# Patient Record
Sex: Male | Born: 1960 | Race: Black or African American | Hispanic: No | Smoking: Never smoker
Health system: Southern US, Community
[De-identification: ages and names within clinical notes are randomized; demographics above are authoritative.]

## PROBLEM LIST (undated history)

## (undated) DIAGNOSIS — C9590 Leukemia, unspecified not having achieved remission: Secondary | ICD-10-CM

## (undated) HISTORY — PX: CYST REMOVAL HAND: SHX6279

---

## 2006-10-11 ENCOUNTER — Emergency Department: Payer: Self-pay | Admitting: Emergency Medicine

## 2009-05-16 ENCOUNTER — Emergency Department: Payer: Self-pay | Admitting: Emergency Medicine

## 2015-12-28 ENCOUNTER — Emergency Department: Payer: BLUE CROSS/BLUE SHIELD

## 2015-12-28 ENCOUNTER — Encounter: Payer: Self-pay | Admitting: Emergency Medicine

## 2015-12-28 ENCOUNTER — Emergency Department
Admission: EM | Admit: 2015-12-28 | Discharge: 2015-12-28 | Disposition: A | Discharge status: Home / Self Care | Payer: BLUE CROSS/BLUE SHIELD | Attending: Emergency Medicine | Admitting: Emergency Medicine

## 2015-12-28 DIAGNOSIS — C9590 Leukemia, unspecified not having achieved remission: Secondary | ICD-10-CM | POA: Diagnosis not present

## 2015-12-28 DIAGNOSIS — X501XXA Overexertion from prolonged static or awkward postures, initial encounter: Secondary | ICD-10-CM | POA: Diagnosis not present

## 2015-12-28 DIAGNOSIS — Y999 Unspecified external cause status: Secondary | ICD-10-CM | POA: Diagnosis not present

## 2015-12-28 DIAGNOSIS — M25571 Pain in right ankle and joints of right foot: Secondary | ICD-10-CM | POA: Diagnosis present

## 2015-12-28 DIAGNOSIS — Y939 Activity, unspecified: Secondary | ICD-10-CM | POA: Diagnosis not present

## 2015-12-28 DIAGNOSIS — Y929 Unspecified place or not applicable: Secondary | ICD-10-CM | POA: Diagnosis not present

## 2015-12-28 DIAGNOSIS — S93401A Sprain of unspecified ligament of right ankle, initial encounter: Secondary | ICD-10-CM | POA: Diagnosis not present

## 2015-12-28 HISTORY — DX: Leukemia, unspecified not having achieved remission: C95.90

## 2015-12-28 MED ORDER — NAPROXEN 500 MG PO TBEC: 500.0000 mg | DELAYED_RELEASE_TABLET | Freq: Two times a day (BID) | ORAL | Status: DC

## 2015-12-28 NOTE — Discharge Instructions (Signed)
Ankle Sprain °An ankle sprain is an injury to the strong, fibrous tissues (ligaments) that hold the bones of your ankle joint together.  °CAUSES °An ankle sprain is usually caused by a fall or by twisting your ankle. Ankle sprains most commonly occur when you step on the outer edge of your foot, and your ankle turns inward. People who participate in sports are more prone to these types of injuries.  °SYMPTOMS  °· Pain in your ankle. The pain may be present at rest or only when you are trying to stand or walk. °· Swelling. °· Bruising. Bruising may develop immediately or within 1 to 2 days after your injury. °· Difficulty standing or walking, particularly when turning corners or changing directions. °DIAGNOSIS  °Your caregiver will ask you details about your injury and perform a physical exam of your ankle to determine if you have an ankle sprain. During the physical exam, your caregiver will press on and apply pressure to specific areas of your foot and ankle. Your caregiver will try to move your ankle in certain ways. An X-ray exam may be done to be sure a bone was not broken or a ligament did not separate from one of the bones in your ankle (avulsion fracture).  °TREATMENT  °Certain types of braces can help stabilize your ankle. Your caregiver can make a recommendation for this. Your caregiver may recommend the use of medicine for pain. If your sprain is severe, your caregiver may refer you to a surgeon who helps to restore function to parts of your skeletal system (orthopedist) or a physical therapist. °HOME CARE INSTRUCTIONS  °· Apply ice to your injury for 1-2 days or as directed by your caregiver. Applying ice helps to reduce inflammation and pain. °¨ Put ice in a plastic bag. °¨ Place a towel between your skin and the bag. °¨ Leave the ice on for 15-20 minutes at a time, every 2 hours while you are awake. °· Only take over-the-counter or prescription medicines for pain, discomfort, or fever as directed by  your caregiver. °· Elevate your injured ankle above the level of your heart as much as possible for 2-3 days. °· If your caregiver recommends crutches, use them as instructed. Gradually put weight on the affected ankle. Continue to use crutches or a cane until you can walk without feeling pain in your ankle. °· If you have a plaster splint, wear the splint as directed by your caregiver. Do not rest it on anything harder than a pillow for the first 24 hours. Do not put weight on it. Do not get it wet. You may take it off to take a shower or bath. °· You may have been given an elastic bandage to wear around your ankle to provide support. If the elastic bandage is too tight (you have numbness or tingling in your foot or your foot becomes cold and blue), adjust the bandage to make it comfortable. °· If you have an air splint, you may blow more air into it or let air out to make it more comfortable. You may take your splint off at night and before taking a shower or bath. Wiggle your toes in the splint several times per day to decrease swelling. °SEEK MEDICAL CARE IF:  °· You have rapidly increasing bruising or swelling. °· Your toes feel extremely cold or you lose feeling in your foot. °· Your pain is not relieved with medicine. °SEEK IMMEDIATE MEDICAL CARE IF: °· Your toes are numb or blue. °·   You have severe pain that is increasing. MAKE SURE YOU:   Understand these instructions.  Will watch your condition.  Will get help right away if you are not doing well or get worse.   This information is not intended to replace advice given to you by your health care provider. Make sure you discuss any questions you have with your health care provider.   Document Released: 09/16/2005 Document Revised: 10/07/2014 Document Reviewed: 09/28/2011 Elsevier Interactive Patient Education 2016 [employer].  Take the Naprosyn as needed. Rest with the foot elevated when seated. Wear your ace bandage for support.

## 2015-12-28 NOTE — ED Notes (Signed)
Rolled right ankle a few weeks ago, last night "it swelled up" and is painful to walk on this am. Walked with limp to triage.

## 2015-12-28 NOTE — ED Provider Notes (Signed)
[facility] Emergency Department Provider Note ____________________________________________  Time seen: 1131  I have reviewed the triage vital signs and the nursing notes.  HISTORY  Chief Complaint  Ankle Pain  HPI Jared Avery is a 55 y.o. male presents to the ED for evaluation of right ankle pain with onset yesterday. He describes initially he injured his ankle 2 weeks ago after he stepped on a piece of uneven concrete and rolled his ankle. He had 1-2 days of swelling to the outside of the ankle and some minor pain. Yesterday he noticed some swelling again to the ankle but denies any reinjury.He rates his pain at a 5/10 in triage.  Past Medical History  Diagnosis Date  . Leukemia ([place])     There are no active problems to display for this patient.  History reviewed. No pertinent past surgical history.  Current Outpatient Rx  Name  Route  Sig  Dispense  Refill  . naproxen (EC NAPROSYN) 500 MG EC tablet   Oral   Take 1 tablet (500 mg total) by mouth 2 (two) times daily with a meal.   30 tablet   0     Allergies Review of patient's allergies indicates no known allergies.  No family history on file.  Social History Social History  Substance Use Topics  . Smoking status: Never Smoker   . Smokeless tobacco: None  . Alcohol Use: No   Review of Systems  Constitutional: Negative for fever. Cardiovascular: Negative for chest pain. Normal distal pulses Respiratory: Negative for shortness of breath. Musculoskeletal: Negative for back pain. Right ankle pain as above Skin: Negative for rash. Neurological: Negative for headaches, focal weakness or numbness. ____________________________________________  PHYSICAL EXAM:  VITAL SIGNS: ED Triage Vitals  Enc Vitals Group     BP 12/28/15 1129 129/89 mmHg     Pulse Rate 12/28/15 1129 91     Resp 12/28/15 1129 18     Temp 12/28/15 1129 97.4 F (36.3 C)     Temp Source 12/28/15 1129 Oral     SpO2  12/28/15 1129 96 %     Weight 12/28/15 1112 213 lb (96.616 kg)     Height 12/28/15 1112 5\' 7"  (1.702 m)     Head Cir --      Peak Flow --      Pain Score 12/28/15 1112 5     Pain Loc --      Pain Edu? --      Excl. in [place]? --    Constitutional: Alert and oriented. Well appearing and in no distress. Head: Normocephalic and atraumatic. Hematological/Lymphatic/Immunological: No cervical lymphadenopathy. Cardiovascular: Normal rate, regular rhythm. Normal distal pulses Respiratory: Normal respiratory effort.  Musculoskeletal: Right ankle with minimal swelling noted medially and laterally. Patient is tender to palpation over the medial aspect of the ankle as well as the midfoot medially. Normal ankle range of motion is appreciated. No calf or Achilles tenderness is noted. Nontender with normal range of motion in all extremities.  Neurologic:  Normal gait without ataxia. Normal speech and language. No gross focal neurologic deficits are appreciated. Skin:  Skin is warm, dry and intact. No rash noted. ____________________________________________   RADIOLOGY  Right Ankle IMPRESSION: No acute fracture or subluxation. Diffuse mild soft tissue swelling.  I, Alvan Culpepper, Dannielle Karvonen, personally viewed and evaluated these images (plain radiographs) as part of my medical decision making, as well as reviewing the written report by the radiologist. ____________________________________________  PROCEDURES  Ace bandage  ____________________________________________  INITIAL IMPRESSION / ASSESSMENT AND PLAN / ED COURSE  Patient with what appears to be an ankle sprain with some mild joint swelling. He has underlying degenerative changes to the ankle noted on x-ray. He will be discharged with a prescription for EC-Naprosyn the dose as needed. He'll follow with his primary care provider or [facility] for ongoing symptom management. ____________________________________________  FINAL CLINICAL IMPRESSION(S) /  ED DIAGNOSES  Final diagnoses:  Ankle sprain, right, initial encounter      Melvenia Needles, PA-C 12/28/15 1228  Lisa Roca, MD 12/28/15 1511

## 2015-12-28 NOTE — ED Notes (Signed)
Pt has right ankle pain.  States i rolled my ankle several weeks ago stepping on uneven concrete.  Yesterday swelling began in my ankle.  No new injury. Pt alert.

## 2017-03-05 DIAGNOSIS — I10 Essential (primary) hypertension: Secondary | ICD-10-CM | POA: Insufficient documentation

## 2017-06-18 ENCOUNTER — Ambulatory Visit (INDEPENDENT_AMBULATORY_CARE_PROVIDER_SITE_OTHER): Payer: BLUE CROSS/BLUE SHIELD | Admitting: Urology

## 2017-06-18 ENCOUNTER — Encounter: Payer: Self-pay | Admitting: Urology

## 2017-06-18 VITALS — BP 130/78 | HR 65 | Ht 67.0 in | Wt 226.1 lb

## 2017-06-18 DIAGNOSIS — N402 Nodular prostate without lower urinary tract symptoms: Secondary | ICD-10-CM | POA: Diagnosis not present

## 2017-06-18 DIAGNOSIS — N400 Enlarged prostate without lower urinary tract symptoms: Secondary | ICD-10-CM

## 2017-06-18 DIAGNOSIS — Z125 Encounter for screening for malignant neoplasm of prostate: Secondary | ICD-10-CM

## 2017-06-18 NOTE — Progress Notes (Signed)
06/18/2017 2:34 PM   Jared Avery May 02, 1961 [id number]  Referring provider: Valera Castle, [place], [place] [address]  Chief Complaint  Patient presents with  . New Patient (Initial Visit)    Prostate nodule    HPI: The patient is a 56 year old gentleman who presents today for follow-up of nodular prostate. His wound followed by another urologist for this in the past. His PSAs have always been normal. Current PSA is 1.6 in June 2018. He does have a family history of prostate cancer in his father who ended up passing away from lung cancer. He does have a negative prostate biopsy performed by Dr. Ernst Spell approximately 5 years ago for the nodules on his prostate.  He has no other urological complaints this time. He voids well. 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 He voids with a good stream. He denies hematuria. He does have a distant history of passing one kidney stone.  PMH: Past Medical History:  Diagnosis Date  . Leukemia [facility])     Surgical History: Past Surgical History:  Procedure Laterality Date  . CYST REMOVAL HAND      Home Medications:  Allergies as of 06/18/2017   No Known Allergies     Medication List       Accurate as of 06/18/17  2:34 PM. Always use your most recent med list.          naproxen 500 MG EC tablet Commonly known as:  EC NAPROSYN Take 1 tablet (500 mg total) by mouth 2 (two) times daily with a meal.            Discharge Care Instructions        Start     Ordered   06/18/17 0000  PSA     06/18/17 1433      Allergies: No Known Allergies  Family History: Family History  Problem Relation Age of Onset  . Prostate cancer Father   . Bladder Cancer Neg Hx   . Kidney cancer Neg Hx     Social History:  reports that he has never smoked. He has never used smokeless tobacco. He reports that he does not drink alcohol. His drug history is not on file.  ROS: UROLOGY Frequent Urination?: No Hard  to postpone urination?: No Burning/pain with urination?: No Get up at night to urinate?: No Leakage of urine?: No Urine stream starts and stops?: No Trouble starting stream?: No Do you have to strain to urinate?: No Blood in urine?: No Urinary tract infection?: No Sexually transmitted disease?: No Injury to kidneys or bladder?: No Painful intercourse?: No Weak stream?: No Erection problems?: No Penile pain?: No  Gastrointestinal Nausea?: No Vomiting?: No Indigestion/heartburn?: No Diarrhea?: No Constipation?: No  Constitutional Fever: No Night sweats?: No Weight loss?: No Fatigue?: No  Skin Skin rash/lesions?: No Itching?: No  Eyes Blurred vision?: No Double vision?: No  Ears/Nose/Throat Sore throat?: No Sinus problems?: No  Hematologic/Lymphatic Swollen glands?: No Easy bruising?: No  Cardiovascular Leg swelling?: No Chest pain?: No  Respiratory Cough?: No Shortness of breath?: No  Endocrine Excessive thirst?: No  Musculoskeletal Back pain?: No Joint pain?: No  Neurological Headaches?: No Dizziness?: No  Psychologic Depression?: No Anxiety?: No  Physical Exam: BP 130/78 (BP Location: Left Arm, Patient Position: Sitting, Cuff Size: Normal)   Pulse 65   Ht 5\' 7"  (1.702 m)   Wt 226 lb 1.6 oz (102.6 kg)   BMI 35.41 kg/m  Constitutional:  Alert and oriented, No acute distress. HEENT: [place] AT, moist mucus membranes.  Trachea midline, no masses. Cardiovascular: No clubbing, cyanosis, or edema. Respiratory: Normal respiratory effort, no increased work of breathing. GI: Abdomen is soft, nontender, nondistended, no abdominal masses GU: No CVA tenderness. Normal phallus. Testicles descended bilaterally. Benign. DRE: 2+. He has a 5-10 mm nodule on each lobe of his prostate. The very endophytic nodule. No induration or larger nodules appreciated. Skin: No rashes, bruises or suspicious lesions. Lymph: No cervical or inguinal adenopathy. Neurologic:  Grossly intact, no focal deficits, moving all 4 extremities. Psychiatric: Normal mood and affect.  Laboratory Data: No results found for: WBC, HGB, HCT, MCV, PLT  No results found for: CREATININE  No results found for: PSA  No results found for: TESTOSTERONE  No results found for: HGBA1C  Urinalysis No results found for: COLORURINE, APPEARANCEUR, LABSPEC, PHURINE, GLUCOSEU, HGBUR, BILIRUBINUR, KETONESUR, PROTEINUR, UROBILINOGEN, NITRITE, LEUKOCYTESUR   Assessment & Plan:    1. Prostate nodules I discussed with the patient that his nodules are relatively small in size, so they are likely the same ones that previously led him to undergo his negative biopsy. Since there are no larger nodules and his PSA has been stable for a number of years, I don't think he needs a prostate biopsy again at this time. We'll keep a close eye on this for both growth of the nodules or increase in his PSA. He'll follow-up with repeat PSA prior in one year.  2. BPH Asymptomatic. No treatment necessary.  Return in about 1 year (around 06/18/2018) for PSA prior.  Nickie Retort, MD  [facility] [address], [place], [place] [address] [phone]

## 2017-09-11 DIAGNOSIS — R7303 Prediabetes: Secondary | ICD-10-CM | POA: Insufficient documentation

## 2018-03-09 ENCOUNTER — Emergency Department
Admission: EM | Admit: 2018-03-09 | Discharge: 2018-03-09 | Disposition: A | Discharge status: Home / Self Care | Payer: BLUE CROSS/BLUE SHIELD | Attending: Student in an Organized Health Care Education/Training Program | Admitting: Student in an Organized Health Care Education/Training Program

## 2018-03-09 ENCOUNTER — Emergency Department: Payer: BLUE CROSS/BLUE SHIELD

## 2018-03-09 ENCOUNTER — Encounter: Payer: Self-pay | Admitting: Emergency Medicine

## 2018-03-09 DIAGNOSIS — R2242 Localized swelling, mass and lump, left lower limb: Secondary | ICD-10-CM | POA: Diagnosis present

## 2018-03-09 DIAGNOSIS — Z856 Personal history of leukemia: Secondary | ICD-10-CM | POA: Insufficient documentation

## 2018-03-09 DIAGNOSIS — M25572 Pain in left ankle and joints of left foot: Secondary | ICD-10-CM | POA: Insufficient documentation

## 2018-03-09 DIAGNOSIS — M7989 Other specified soft tissue disorders: Secondary | ICD-10-CM

## 2018-03-09 NOTE — ED Notes (Signed)
Patient transported to Ultrasound 

## 2018-03-09 NOTE — ED Provider Notes (Signed)
[facility] Emergency Department Provider Note    First MD Initiated Contact with Patient 03/09/18 2046     (approximate)  I have reviewed the triage vital signs and the nursing notes.   HISTORY  Chief Complaint Joint Swelling    HPI Jared Avery is a 57 y.o. male presents to the ER with chief complaint of left leg swelling and ankle discomfort for the past day.  Patient just got home from a 12 Hour Dr.  Currently not having any pain but came to the ER because he was having achiness in his left ankle.  Denies any trauma.  Is never had any history of blood clots.  Is not currently on any blood thinners.  No fevers.  No rashes.  No history of gout.    Past Medical History:  Diagnosis Date  . Leukemia ([place])    Family History  Problem Relation Age of Onset  . Prostate cancer Father   . Bladder Cancer Neg Hx   . Kidney cancer Neg Hx    Past Surgical History:  Procedure Laterality Date  . CYST REMOVAL HAND     There are no active problems to display for this patient.     Prior to Admission medications   Medication Sig Start Date End Date Taking? Authorizing Provider  naproxen (EC NAPROSYN) 500 MG EC tablet Take 1 tablet (500 mg total) by mouth 2 (two) times daily with a meal. Patient not taking: Reported on 06/18/2017 12/28/15   Menshew, Dannielle Karvonen, PA-C    Allergies Patient has no known allergies.    Social History Social History   Tobacco Use  . Smoking status: Never Smoker  . Smokeless tobacco: Never Used  Substance Use Topics  . Alcohol use: No  . Drug use: Not on file    Review of Systems Patient denies headaches, rhinorrhea, blurry vision, numbness, shortness of breath, chest pain, edema, cough, abdominal pain, nausea, vomiting, diarrhea, dysuria, fevers, rashes or hallucinations unless otherwise stated above in HPI. ____________________________________________   PHYSICAL EXAM:  VITAL SIGNS: Vitals:   03/09/18 2003  03/09/18 2100  BP: (!) 162/85 124/79  Pulse: (!) 104 100  Resp: 18   Temp: 99 F (37.2 C)   SpO2: 96% 98%    Constitutional: Alert and oriented.  Eyes: Conjunctivae are normal.  Head: Atraumatic. Nose: No congestion/rhinnorhea. Mouth/Throat: Mucous membranes are moist.   Neck: No stridor. Painless ROM.  Cardiovascular: Normal rate, regular rhythm. Grossly normal heart sounds.  Good peripheral circulation. Respiratory: Normal respiratory effort.  No retractions. Lungs CTAB. Gastrointestinal: Soft and nontender. No distention. No abdominal bruits. No CVA tenderness. Genitourinary:  Musculoskeletal: No lower extremity tenderness. L>R pitting edema.  Brisk cap refill. Painless passive and active ROM of left ankle, no overlying warmth or erythema  No joint effusions. Neurologic:  Normal speech and language. No gross focal neurologic deficits are appreciated. No facial droop Skin:  Skin is warm, dry and intact. No rash noted. Psychiatric: Mood and affect are normal. Speech and behavior are normal.  ____________________________________________   LABS (all labs ordered are listed, but only abnormal results are displayed)  No results found for this or any previous visit (from the past 24 hour(s)). ____________________________________________ ____________________________________________  CLEXNTZGY  I personally reviewed all radiographic images ordered to evaluate for the above acute complaints and reviewed radiology reports and findings.  These findings were personally discussed with the patient.  Please see medical record for radiology report.  ____________________________________________  PROCEDURES  Procedure(s) performed:  Procedures    Critical Care performed: no ____________________________________________   INITIAL IMPRESSION / ASSESSMENT AND PLAN / ED COURSE  Pertinent labs & imaging results that were available during my care of the patient were reviewed by me and  considered in my medical decision making (see chart for details).   DDX: fracture, dvt, [place], edema  Jared Avery is a 57 y.o. who presents to the ED with symptoms as described above.  No evidence of DVT on ultrasound.  No evidence of fracture on x-ray.  Not clinically consistent with infectious process.  Unlikely gout.  At this point symptoms most likely secondary to venous insufficiency.  Discussed conservative management follow-up with PCP.      As part of my medical decision making, I reviewed the following data within the [place] notes reviewed and incorporated, Labs reviewed, notes from prior ED visits.   ____________________________________________   FINAL CLINICAL IMPRESSION(S) / ED DIAGNOSES  Final diagnoses:  Acute left ankle pain  Left leg swelling      NEW MEDICATIONS STARTED DURING THIS VISIT:  New Prescriptions   No medications on file     Note:  This document was prepared using Dragon voice recognition software and may include unintentional dictation errors.    Merlyn Lot, MD 03/09/18 2320

## 2018-03-09 NOTE — ED Notes (Signed)
Pt states that he had over a 12 hour drive home this weekend and since then he noticed that his left leg/ankle were swelling - today the left leg/ankle are still swollen and they started hurting/throbbing Per Dr Quentin Cornwall not to draw labs until after results of ultrasound were back

## 2018-03-09 NOTE — ED Triage Notes (Signed)
Pt arrived home from 18 hour drive in car this AM and since has had pain and swelling in the left ankle and lower leg. Swelling noted in the area. No redness noted.

## 2018-03-16 DIAGNOSIS — M25572 Pain in left ankle and joints of left foot: Secondary | ICD-10-CM | POA: Insufficient documentation

## 2018-06-15 ENCOUNTER — Other Ambulatory Visit: Payer: BLUE CROSS/BLUE SHIELD

## 2018-06-15 DIAGNOSIS — N402 Nodular prostate without lower urinary tract symptoms: Secondary | ICD-10-CM

## 2018-06-15 DIAGNOSIS — Z125 Encounter for screening for malignant neoplasm of prostate: Secondary | ICD-10-CM

## 2018-06-15 DIAGNOSIS — N400 Enlarged prostate without lower urinary tract symptoms: Secondary | ICD-10-CM

## 2018-06-16 LAB — PSA: PROSTATE SPECIFIC AG, SERUM: 1.4 ng/mL (ref 0.0–4.0)

## 2018-06-18 ENCOUNTER — Ambulatory Visit: Payer: BLUE CROSS/BLUE SHIELD

## 2018-06-18 ENCOUNTER — Encounter: Payer: Self-pay | Admitting: Urology

## 2018-06-18 ENCOUNTER — Ambulatory Visit (INDEPENDENT_AMBULATORY_CARE_PROVIDER_SITE_OTHER): Payer: BLUE CROSS/BLUE SHIELD | Admitting: Urology

## 2018-06-18 VITALS — BP 142/92 | HR 70 | Ht 67.0 in | Wt 232.5 lb

## 2018-06-18 DIAGNOSIS — N402 Nodular prostate without lower urinary tract symptoms: Secondary | ICD-10-CM | POA: Diagnosis not present

## 2018-06-18 DIAGNOSIS — N400 Enlarged prostate without lower urinary tract symptoms: Secondary | ICD-10-CM

## 2018-06-18 DIAGNOSIS — C9210 Chronic myeloid leukemia, BCR/ABL-positive, not having achieved remission: Secondary | ICD-10-CM | POA: Insufficient documentation

## 2018-06-18 NOTE — Progress Notes (Signed)
06/18/2018 9:52 AM   Jared Avery [birth year] [id number]  Referring provider: Valera Castle, [place], [place] [address]  No chief complaint on file.   HPI: Patient is a 57 year old male with a history of nodular prostate who presents today for follow up.  His was followed by another urologist for this in the past.  His PSAs have always been normal. Current PSA is 1.4 in September 2019. He does have a family history of prostate cancer in his father who ended up passing away from lung cancer. He does have a negative prostate biopsy performed by Dr. Ernst Spell approximately 6 years ago for the nodules on his prostate.  He has no other urological complaints this time. He voids well. 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 He voids with a good stream. He denies hematuria. He does have a distant history of passing one kidney stone.  PMH: Past Medical History:  Diagnosis Date  . Leukemia [facility])     Surgical History: Past Surgical History:  Procedure Laterality Date  . CYST REMOVAL HAND      Home Medications:  Allergies as of 06/18/2018   No Known Allergies     Medication List    as of 06/18/2018  9:52 AM   You have not been prescribed any medications.     Allergies: No Known Allergies  Family History: Family History  Problem Relation Age of Onset  . Prostate cancer Father   . Bladder Cancer Neg Hx   . Kidney cancer Neg Hx     Social History:  reports that he has never smoked. He has never used smokeless tobacco. He reports that he does not drink alcohol. His drug history is not on file.  ROS: UROLOGY Frequent Urination?: No Hard to postpone urination?: No Burning/pain with urination?: No Get up at night to urinate?: No Leakage of urine?: No Urine stream starts and stops?: No Trouble starting stream?: No Do you have to strain to urinate?: No Blood in urine?: No Urinary tract infection?: No Sexually transmitted disease?:  No Injury to kidneys or bladder?: No Painful intercourse?: No Weak stream?: No Erection problems?: No Penile pain?: No  Gastrointestinal Nausea?: No Vomiting?: No Indigestion/heartburn?: No Diarrhea?: No Constipation?: No  Constitutional Fever: No Night sweats?: No Weight loss?: No Fatigue?: No  Skin Skin rash/lesions?: No Itching?: No  Eyes Blurred vision?: No Double vision?: No  Ears/Nose/Throat Sore throat?: No Sinus problems?: No  Hematologic/Lymphatic Swollen glands?: No Easy bruising?: No  Cardiovascular Leg swelling?: No Chest pain?: No  Respiratory Cough?: No Shortness of breath?: No  Endocrine Excessive thirst?: No  Musculoskeletal Back pain?: No Joint pain?: No  Neurological Headaches?: No Dizziness?: No  Psychologic Depression?: No Anxiety?: No  Physical Exam: BP (!) 142/92 (BP Location: Left Arm, Patient Position: Sitting, Cuff Size: Large)   Pulse 70   Ht 5\' 7"  (1.702 m)   Wt 232 lb 8 oz (105.5 kg)   BMI 36.41 kg/m   Constitutional: Well nourished. Alert and oriented, No acute distress. HEENT: [place] AT, moist mucus membranes. Trachea midline, no masses. Cardiovascular: No clubbing, cyanosis, or edema. Respiratory: Normal respiratory effort, no increased work of breathing. GI: Abdomen is soft, non tender, non distended, no abdominal masses. Liver and spleen not palpable.  No hernias appreciated.  Stool sample for occult testing is not indicated.   GU: No CVA tenderness.  No bladder fullness or masses.  Patient with uncircumcised phallus.  Foreskin easily retracted Urethral meatus is patent.  No penile discharge. No penile lesions or rashes. Scrotum without lesions, cysts, rashes and/or edema.  Testicles are located scrotally bilaterally. No masses are appreciated in the testicles. Left and right epididymis are normal. Rectal: Patient with  normal sphincter tone. Anus and perineum without scarring or rashes. No rectal masses are  appreciated. Prostate is approximately 50 grams, could only palpate the apex, 5 -10 mm nodule noted.  Skin: No rashes, bruises or suspicious lesions. Lymph: No cervical or inguinal adenopathy. Neurologic: Grossly intact, no focal deficits, moving all 4 extremities. Psychiatric: Normal mood and affect.   Laboratory Data: No results found for: WBC, HGB, HCT, MCV, PLT  No results found for: CREATININE  No results found for: PSA  No results found for: TESTOSTERONE  No results found for: HGBA1C  Urinalysis No results found for: COLORURINE, APPEARANCEUR, LABSPEC, PHURINE, GLUCOSEU, HGBUR, BILIRUBINUR, KETONESUR, PROTEINUR, UROBILINOGEN, NITRITE, LEUKOCYTESUR   Assessment & Plan:    1. Prostate nodules I could only palpate one nodule on today's visit but it is unchanged and his PSA has remained stable We will continue to monitor with yearly DRE's and PSAs  2. BPH Asymptomatic. No treatment necessary.  Return in about 1 year (around 06/19/2019) for PSA and exam.  Zara Council, [facility]  [place] [address] [place], [place] [address] [phone]

## 2018-06-30 ENCOUNTER — Emergency Department
Admission: EM | Admit: 2018-06-30 | Discharge: 2018-06-30 | Disposition: A | Discharge status: Home / Self Care | Payer: No Typology Code available for payment source | Attending: Emergency Medicine | Admitting: Emergency Medicine

## 2018-06-30 ENCOUNTER — Other Ambulatory Visit: Payer: Self-pay

## 2018-06-30 DIAGNOSIS — S0990XA Unspecified injury of head, initial encounter: Secondary | ICD-10-CM | POA: Diagnosis present

## 2018-06-30 DIAGNOSIS — Z856 Personal history of leukemia: Secondary | ICD-10-CM | POA: Insufficient documentation

## 2018-06-30 DIAGNOSIS — Y990 Civilian activity done for income or pay: Secondary | ICD-10-CM | POA: Insufficient documentation

## 2018-06-30 DIAGNOSIS — Y939 Activity, unspecified: Secondary | ICD-10-CM | POA: Diagnosis not present

## 2018-06-30 DIAGNOSIS — I10 Essential (primary) hypertension: Secondary | ICD-10-CM | POA: Insufficient documentation

## 2018-06-30 DIAGNOSIS — Y9289 Other specified places as the place of occurrence of the external cause: Secondary | ICD-10-CM | POA: Diagnosis not present

## 2018-06-30 DIAGNOSIS — S0101XA Laceration without foreign body of scalp, initial encounter: Secondary | ICD-10-CM | POA: Diagnosis not present

## 2018-06-30 DIAGNOSIS — W208XXA Other cause of strike by thrown, projected or falling object, initial encounter: Secondary | ICD-10-CM | POA: Diagnosis not present

## 2018-06-30 MED ORDER — LIDOCAINE HCL (PF) 1 % IJ SOLN
INTRAMUSCULAR | Status: AC
  Filled 2018-06-30: qty 5

## 2018-06-30 NOTE — ED Notes (Signed)
UDS performed by this EDT and sent to [facility] to be sent for testing.

## 2018-06-30 NOTE — ED Provider Notes (Signed)
[facility] Emergency Department Provider Note ____________________________________________   First MD Initiated Contact with Patient 06/30/18 [phone]     (approximate)  I have reviewed the triage vital signs and the nursing notes.   HISTORY  Chief Complaint Head Laceration    HPI Jared Avery is a 57 y.o. male with PMH as noted below who presents with head injury, acute onset approximately an hour ago when a piece of equipment hit him on the top of his head, not associated with LOC, dizziness, headache, or nausea.  He states that a first responder at work took a look at the laceration told him he needed to come to the hospital.  He has no other injuries.  He is not on any blood thinners.  Past Medical History:  Diagnosis Date  . Leukemia [facility])     Patient Active Problem List   Diagnosis Date Noted  . 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 Left ankle pain 03/16/2018  . Prediabetes 09/11/2017  . Essential hypertension 03/05/2017    Past Surgical History:  Procedure Laterality Date  . CYST REMOVAL HAND      Prior to Admission medications   Not on File    Allergies Patient has no known allergies.  Family History  Problem Relation Age of Onset  . Prostate cancer Father   . Bladder Cancer Neg Hx   . Kidney cancer Neg Hx     Social History Social History   Tobacco Use  . Smoking status: Never Smoker  . Smokeless tobacco: Never Used  Substance Use Topics  . Alcohol use: No  . Drug use: Not on file    Review of Systems  Constitutional: No weakness. Eyes: No visual changes. ENT: No neck pain. Cardiovascular: Denies syncope. Respiratory: Denies shortness of breath. Gastrointestinal: No nausea.  Genitourinary: Negative for flank pain.  Musculoskeletal: Negative for back pain. Skin: Negative for rash. Neurological: Negative for headache.   ____________________________________________   PHYSICAL EXAM:  VITAL  SIGNS: ED Triage Vitals  Enc Vitals Group     BP 06/30/18 0410 (!) 143/74     Pulse Rate 06/30/18 0410 76     Resp 06/30/18 0410 16     Temp 06/30/18 0410 98.2 F (36.8 C)     Temp Source 06/30/18 0410 Oral     SpO2 06/30/18 0410 97 %     Weight 06/30/18 0411 233 lb (105.7 kg)     Height 06/30/18 0411 5\' 7"  (1.702 m)     Head Circumference --      Peak Flow --      Pain Score 06/30/18 0411 0     Pain Loc --      Pain Edu? --      Excl. in [place]? --     Constitutional: Alert and oriented. Well appearing and in no acute distress. Eyes: Conjunctivae are normal.  Head: 2.5 cm curved laceration to parietal area. Nose: No congestion/rhinnorhea. Mouth/Throat: Mucous membranes are moist.   Neck: Normal range of motion.  Cardiovascular: Good peripheral circulation. Respiratory: Normal respiratory effort. Gastrointestinal: No distention.  Musculoskeletal: Extremities warm and well perfused.  Neurologic:  Normal speech and language.  Motor and sensory intact in all extremities.  Cranial nerves III through XII intact.  Normal coordination.  No gross focal neurologic deficits are appreciated.  Skin:  Skin is warm and dry. No rash noted. Psychiatric: Mood and affect are normal. Speech and behavior are normal.  ____________________________________________   LABS (  all labs ordered are listed, but only abnormal results are displayed)  Labs Reviewed - No data to display ____________________________________________  EKG   ____________________________________________  RADIOLOGY    ____________________________________________   PROCEDURES  Procedure(s) performed: Yes   .Marland KitchenLaceration Repair Date/Time: 06/30/2018 5:40 AM Performed by: Arta Silence, MD Authorized by: Arta Silence, MD   Consent:    Consent obtained:  Verbal   Consent given by:  Patient   Risks discussed:  Infection, pain, retained foreign body, poor cosmetic result and poor wound healing Anesthesia  (see MAR for exact dosages):    Anesthesia method:  Local infiltration   Local anesthetic:  Lidocaine 1% w/o epi Laceration details:    Location:  Scalp   Scalp location:  Crown   Length (cm):  2.5   Depth (mm):  3 Repair type:    Repair type:  Simple Exploration:    Hemostasis achieved with:  Direct pressure   Wound exploration: entire depth of wound probed and visualized     Contaminated: no   Treatment:    Area cleansed with:  Saline and Betadine   Amount of cleaning:  Standard   Visualized foreign bodies/material removed: no   Skin repair:    Repair method:  Staples   Number of staples:  3 Approximation:    Approximation:  Close Post-procedure details:    Dressing:  Sterile dressing   Patient tolerance of procedure:  Tolerated well, no immediate complications    Critical Care performed: No ____________________________________________   INITIAL IMPRESSION / ASSESSMENT AND PLAN / ED COURSE  Pertinent labs & imaging results that were available during my care of the patient were reviewed by me and considered in my medical decision making (see chart for details).  57 year old male presents with minor head injury with no LOC, neurologic symptoms, nausea, or dizziness.  On exam, he is well-appearing and his neuro exam is normal.  The patient has a scalp laceration which will require repair.  He has no evidence of TBI or indication for imaging at this time.  ----------------------------------------- 5:41 AM on 06/30/2018 -----------------------------------------  Laceration repaired successfully.  Return precautions given, and the patient expresses understanding. ____________________________________________   FINAL CLINICAL IMPRESSION(S) / ED DIAGNOSES  Final diagnoses:  Laceration of scalp, initial encounter      NEW MEDICATIONS STARTED DURING THIS VISIT:  New Prescriptions   No medications on file     Note:  This document was prepared using Dragon voice  recognition software and may include unintentional dictation errors.    Arta Silence, MD 06/30/18 [phone]

## 2018-06-30 NOTE — Discharge Instructions (Addendum)
Return in 7 to 10 days to have the staples removed.  Return to the ER sooner for new or worsening pain, bleeding, pus drainage, rash, or any other new or worsening symptoms that concern you.

## 2018-06-30 NOTE — ED Triage Notes (Signed)
Pt arrives to ED via POV from work with c/o head laceration d/t a part falling off a conveyor belt and hitting pt's head. No LOC, no N/V. Pt is filing w/c; profile indicates UDS is required.

## 2018-07-07 IMAGING — DX DG ANKLE COMPLETE 3+V*L*
3 series · 3 of 3 positions shown · non-contrast
Comparison: None.

CLINICAL DATA: Pain and swelling of the left ankle and leg.

EXAM:
LEFT ANKLE COMPLETE - 3+ VIEW

[ankle ap]
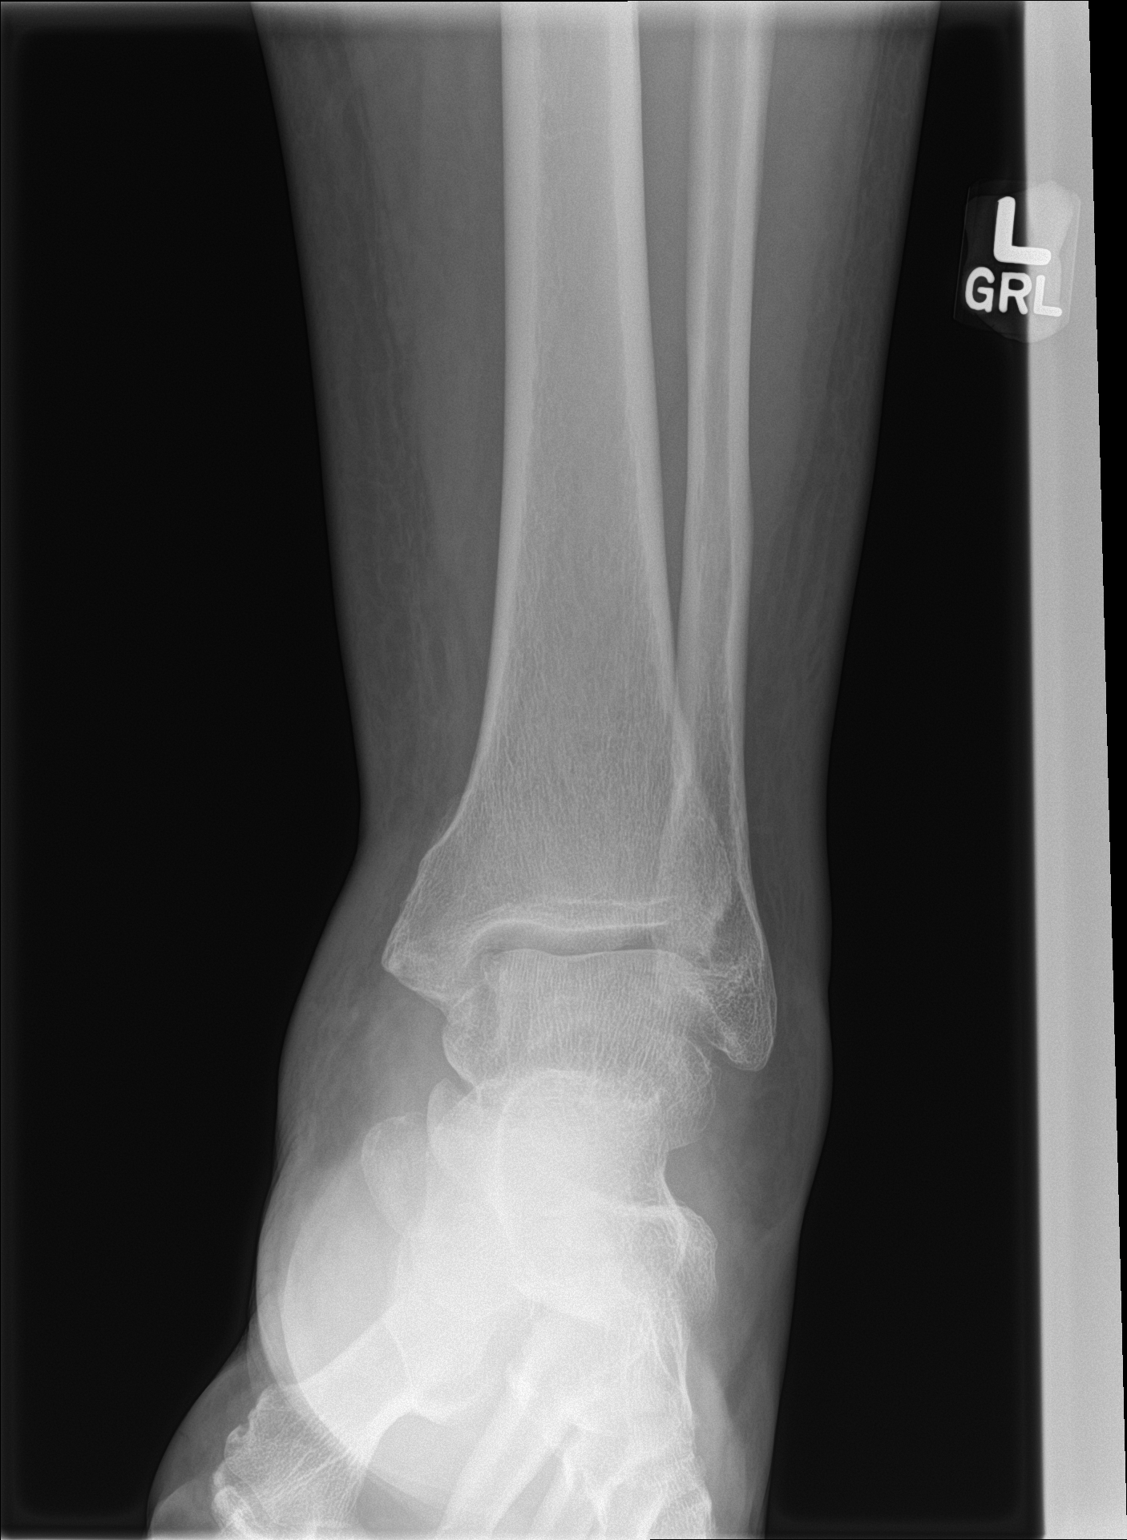

[ankle obl]
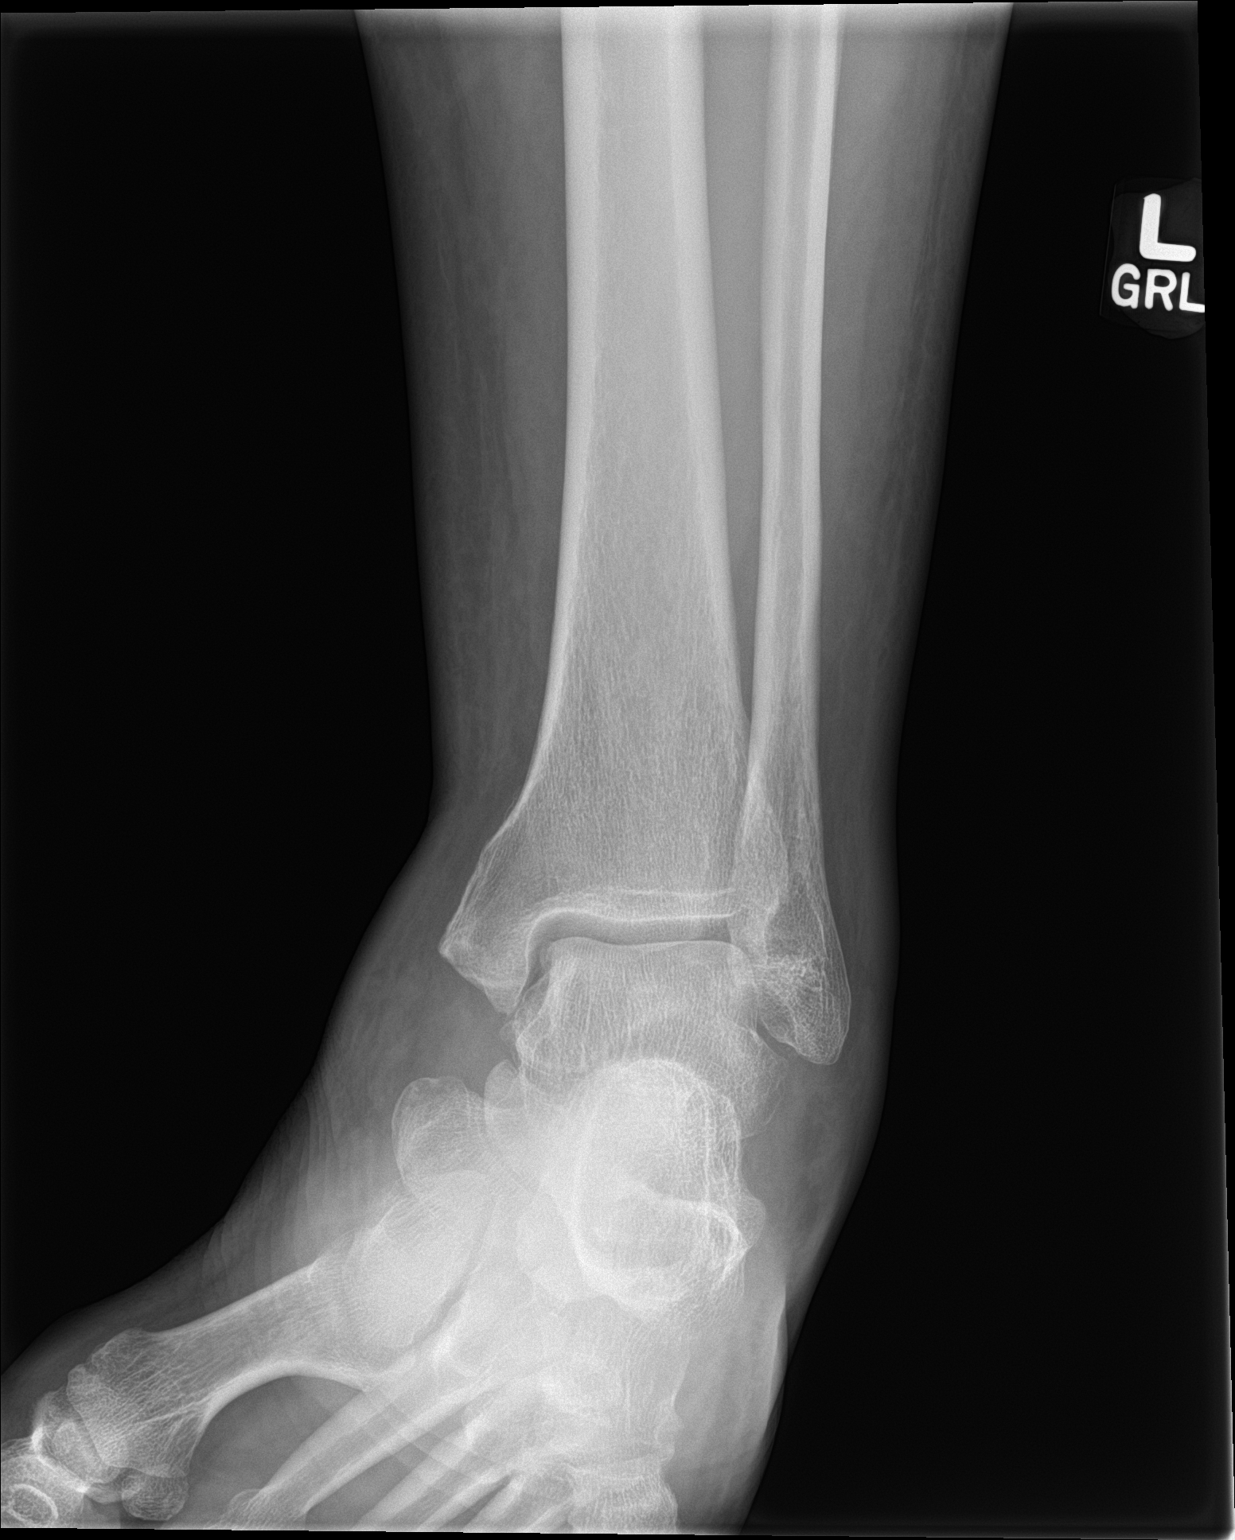

[ankle lat]
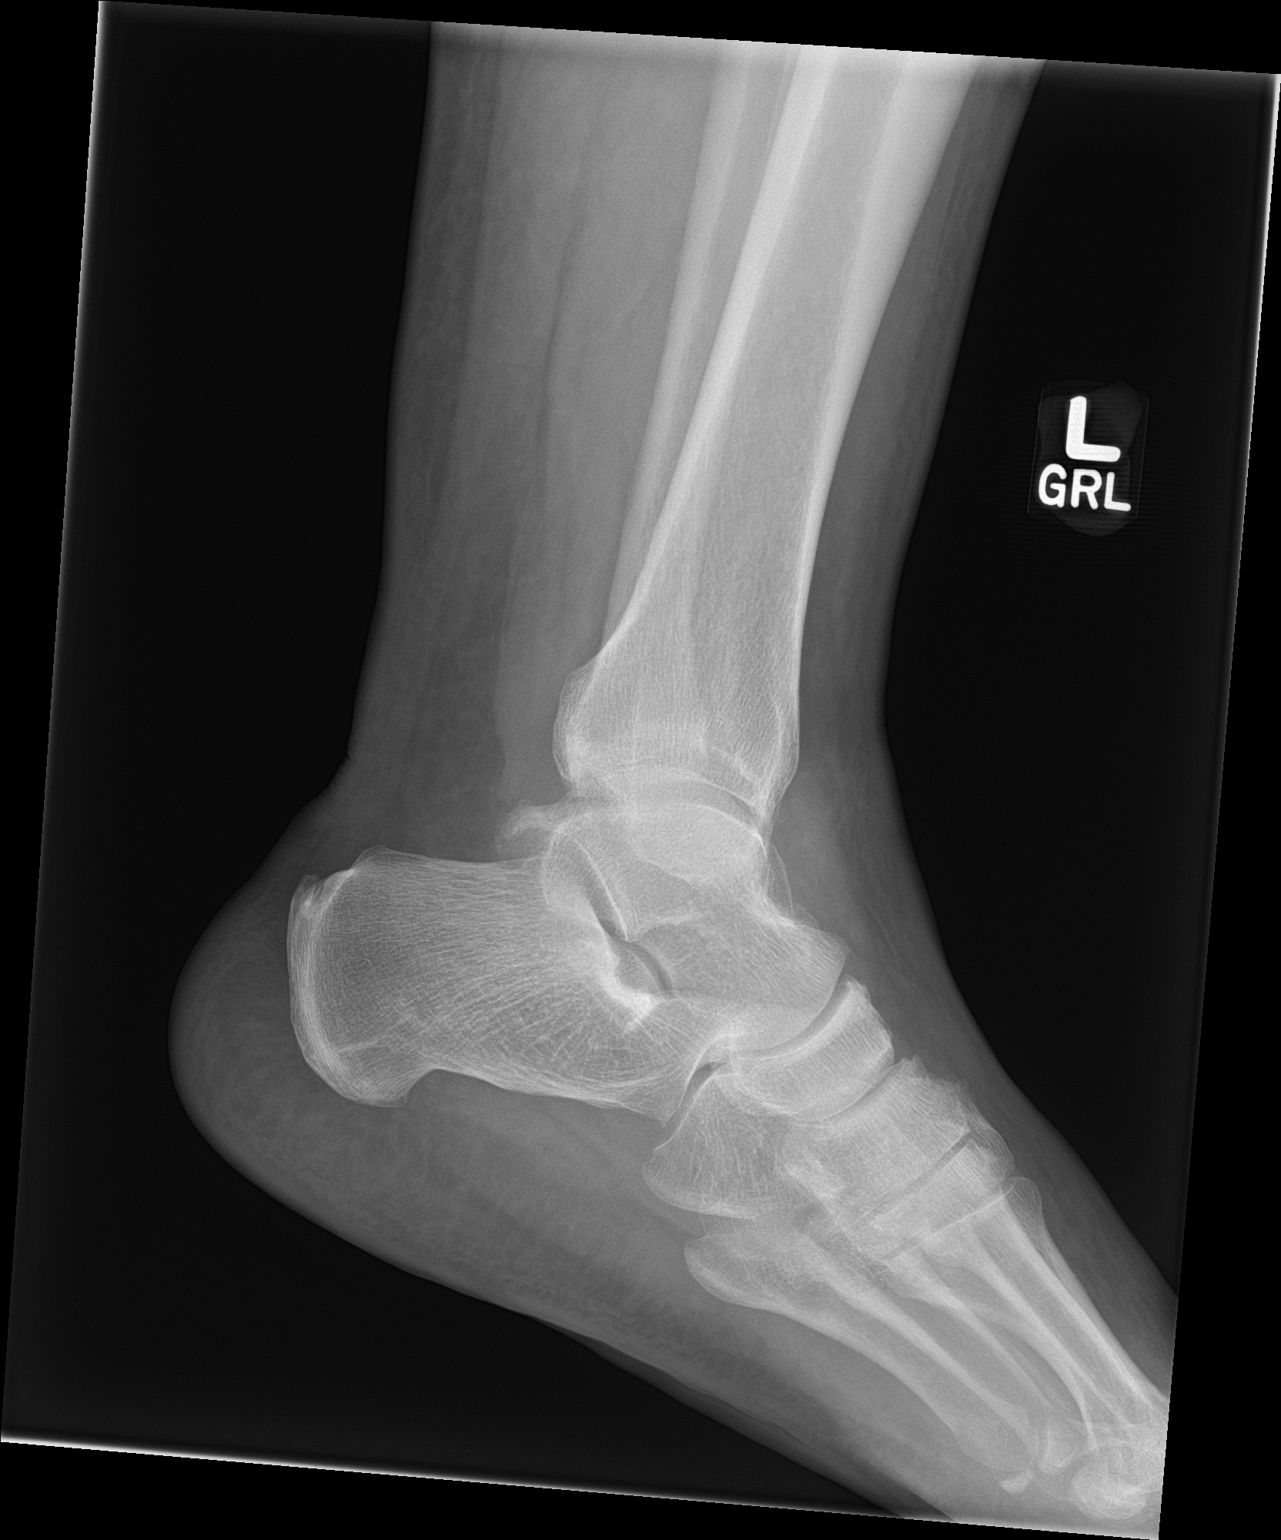

[3 of 3 positions shown; findings below may reference images not displayed]

FINDINGS: Mild diffuse soft tissue induration of the included left leg and
about the malleoli. Findings could be secondary to venous
insufficiency. No acute fracture nor bone destruction. Dorsal and
plantar calcaneal enthesopathy is seen. Degenerative spurring is
also noted off the dorsum of the midfoot. Intact tibiotalar and
subtalar joints. Probable small ankle joint effusion. There is no
acute fracture.
IMPRESSION: Mild generalized soft tissue swelling of the included leg and ankle.
Findings could be related to venous insufficiency. No acute osseous
abnormality.

## 2018-07-08 ENCOUNTER — Other Ambulatory Visit: Payer: Self-pay

## 2018-07-08 ENCOUNTER — Encounter: Payer: Self-pay | Admitting: Emergency Medicine

## 2018-07-08 ENCOUNTER — Emergency Department
Admission: EM | Admit: 2018-07-08 | Discharge: 2018-07-08 | Disposition: A | Discharge status: Home / Self Care | Payer: BLUE CROSS/BLUE SHIELD | Attending: Emergency Medicine | Admitting: Emergency Medicine

## 2018-07-08 DIAGNOSIS — S0101XD Laceration without foreign body of scalp, subsequent encounter: Secondary | ICD-10-CM | POA: Diagnosis present

## 2018-07-08 DIAGNOSIS — I10 Essential (primary) hypertension: Secondary | ICD-10-CM | POA: Insufficient documentation

## 2018-07-08 DIAGNOSIS — Z4802 Encounter for removal of sutures: Secondary | ICD-10-CM | POA: Diagnosis not present

## 2018-07-08 DIAGNOSIS — X58XXXD Exposure to other specified factors, subsequent encounter: Secondary | ICD-10-CM | POA: Insufficient documentation

## 2018-07-08 DIAGNOSIS — Z856 Personal history of leukemia: Secondary | ICD-10-CM | POA: Insufficient documentation

## 2018-07-08 NOTE — ED Provider Notes (Signed)
[facility] Emergency Department Provider Note   ____________________________________________   First MD Initiated Contact with Patient 07/08/18 0703     (approximate)  I have reviewed the triage vital signs and the nursing notes.   HISTORY  Chief Complaint Suture / Staple Removal    HPI Jared Avery is a 57 y.o. male patient presents for staple removal from the scalp secondary to a laceration 1 week ago.  Patient reports no complaints.  Past Medical History:  Diagnosis Date  . Leukemia [facility])     Patient Active Problem List   Diagnosis Date Noted  . 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 Left ankle pain 03/16/2018  . Prediabetes 09/11/2017  . Essential hypertension 03/05/2017    Past Surgical History:  Procedure Laterality Date  . CYST REMOVAL HAND      Prior to Admission medications   Not on File    Allergies Patient has no known allergies.  Family History  Problem Relation Age of Onset  . Prostate cancer Father   . Bladder Cancer Neg Hx   . Kidney cancer Neg Hx     Social History Social History   Tobacco Use  . Smoking status: Never Smoker  . Smokeless tobacco: Never Used  Substance Use Topics  . Alcohol use: No  . Drug use: Not on file    Review of Systems  Constitutional: No fever/chills Eyes: No visual changes. ENT: No sore throat. Cardiovascular: Denies chest pain. Respiratory: Denies shortness of breath. Gastrointestinal: No abdominal pain.  No nausea, no vomiting.  No diarrhea.  No constipation. Genitourinary: Negative for dysuria. Musculoskeletal: Negative for back pain. Skin: Negative for rash. Neurological: Negative for headaches, focal weakness or numbness. Endocrine: Prediabetes and hypertension. Hematological/Lymphatic:CML.   ____________________________________________   PHYSICAL EXAM:  VITAL SIGNS: ED Triage Vitals  Enc Vitals Group     BP 07/08/18 0658 (!) 144/82   Pulse Rate 07/08/18 0658 70     Resp 07/08/18 0658 16     Temp 07/08/18 0658 98.4 F (36.9 C)     Temp Source 07/08/18 0658 Oral     SpO2 07/08/18 0658 98 %     Weight 07/08/18 0656 232 lb 15.7 oz (105.7 kg)     Height 07/08/18 0656 5\' 7"  (1.702 m)     Head Circumference --      Peak Flow --      Pain Score 07/08/18 0656 0     Pain Loc --      Pain Edu? --      Excl. in [place]? --    Constitutional: Alert and oriented. Well appearing and in no acute distress. Cardiovascular: Normal rate, regular rhythm. Grossly normal heart sounds.  Good peripheral circulation. Respiratory: Normal respiratory effort.  No retractions. Lungs CTAB. Neurologic:  Normal speech and language. No gross focal neurologic deficits are appreciated. No gait instability. Skin:  Skin is warm, dry and intact. No rash noted.  3 staples.  Psychiatric: Mood and affect are normal. Speech and behavior are normal.  ____________________________________________   LABS (all labs ordered are listed, but only abnormal results are displayed)  Labs Reviewed - No data to display ____________________________________________  EKG   ____________________________________________  RADIOLOGY  ED MD interpretation:    Official radiology report(s): No results found.  ____________________________________________   PROCEDURES  Procedure(s) performed: None  .Suture Removal Date/Time: 07/08/2018 7:12 AM Performed by: Sable Feil, PA-C Authorized by: Sable Feil, PA-C   Consent:  Consent obtained:  Verbal   Consent given by:  Patient   Risks discussed:  Bleeding, pain and wound separation Location:    Location:  Head/neck   Head/neck location:  Scalp Procedure details:    Wound appearance:  No signs of infection, good wound healing and clean   Number of staples removed:  3 Post-procedure details:    Post-removal:  No dressing applied   Patient tolerance of procedure:  Tolerated well, no immediate  complications    Critical Care performed: No  ____________________________________________   INITIAL IMPRESSION / ASSESSMENT AND PLAN / ED COURSE  As part of my medical decision making, I reviewed the following data within the [place]    Patient presents for staple removal from the scalp.  Area was clean and 3 staples removed.  Patient given discharge care instructions and advised to follow-up as needed.      ____________________________________________   FINAL CLINICAL IMPRESSION(S) / ED DIAGNOSES  Final diagnoses:  Removal of staples     ED Discharge Orders    None       Note:  This document was prepared using Dragon voice recognition software and may include unintentional dictation errors.    Sable Feil, PA-C 07/08/18 [id number]    Drenda Freeze, MD 07/09/18 [phone]

## 2018-07-08 NOTE — ED Notes (Signed)
See triage note  Denies any other complaints

## 2018-07-08 NOTE — ED Triage Notes (Signed)
Here to have staples removed. Placed one week ago. Denies complications.

## 2019-06-18 ENCOUNTER — Other Ambulatory Visit: Payer: Self-pay

## 2019-06-18 ENCOUNTER — Other Ambulatory Visit: Payer: BC Managed Care – PPO

## 2019-06-18 DIAGNOSIS — N400 Enlarged prostate without lower urinary tract symptoms: Secondary | ICD-10-CM

## 2019-06-18 DIAGNOSIS — Z125 Encounter for screening for malignant neoplasm of prostate: Secondary | ICD-10-CM

## 2019-06-18 DIAGNOSIS — N402 Nodular prostate without lower urinary tract symptoms: Secondary | ICD-10-CM

## 2019-06-19 LAB — PSA: Prostate Specific Ag, Serum: 1.8 ng/mL (ref 0.0–4.0)

## 2019-06-21 NOTE — Progress Notes (Signed)
06/22/2019 9:13 AM   Jared Avery 1961-05-16 [id number]  Referring provider: [place], [address] [place],  [place] [address]  Chief Complaint  Patient presents with  . Benign Prostatic Hypertrophy    HPI: Patient is a 58 year old male with a history of nodular prostate who presents today for follow up.  His was followed by another urologist for this in the past.  His PSAs have always been normal. Current PSA is 1.4 in September 2019. He does have a family history of prostate cancer in his father who ended up passing away from lung cancer. He does have a negative prostate biopsy performed by Dr. Madelin Headings approximately 10 years ago for the nodules on his prostate.  He has no urinary complaints at this time.  Patient denies any gross hematuria, dysuria or suprapubic/flank pain.  Patient denies any fevers, chills, nausea or vomiting.   He has no erectile dysfunction.  He denies any curvature or pain with erections.     PMH: Past Medical History:  Diagnosis Date  . Leukemia [facility])     Surgical History: Past Surgical History:  Procedure Laterality Date  . CYST REMOVAL HAND      Home Medications:  Allergies as of 06/22/2019   No Known Allergies     Medication List       Accurate as of June 22, 2019  9:13 AM. If you have any questions, ask your nurse or doctor.        amLODipine 5 MG tablet Commonly known as: NORVASC Take by mouth.       Allergies: No Known Allergies  Family History: Family History  Problem Relation Age of Onset  . Prostate cancer Father   . Bladder Cancer Neg Hx   . Kidney cancer Neg Hx     Social History:  reports that he has never smoked. He has never used smokeless tobacco. He reports that he does not drink alcohol. No history on file for drug.  ROS: UROLOGY Frequent Urination?: No Hard to postpone urination?: No Burning/pain with urination?: No Get up at night to urinate?: No Leakage of urine?: No Urine  stream starts and stops?: No Trouble starting stream?: No Do you have to strain to urinate?: No Blood in urine?: No Urinary tract infection?: No Sexually transmitted disease?: No Injury to kidneys or bladder?: No Painful intercourse?: No Weak stream?: No Erection problems?: No Penile pain?: No  Gastrointestinal Nausea?: No Vomiting?: No Indigestion/heartburn?: No Diarrhea?: No Constipation?: No  Constitutional Fever: No Night sweats?: No Weight loss?: No Fatigue?: No  Skin Skin rash/lesions?: No Itching?: No  Eyes Blurred vision?: No Double vision?: No  Ears/Nose/Throat Sore throat?: No Sinus problems?: No  Hematologic/Lymphatic Swollen glands?: No Easy bruising?: No  Cardiovascular Leg swelling?: No Chest pain?: No  Respiratory Cough?: No Shortness of breath?: No  Endocrine Excessive thirst?: No  Musculoskeletal Back pain?: No Joint pain?: No  Neurological Headaches?: No Dizziness?: No  Psychologic Depression?: No Anxiety?: No  Physical Exam: BP (!) 142/91 (BP Location: Left Arm, Patient Position: Sitting, Cuff Size: Normal)   Pulse 81   Ht 5\' 7"  (1.702 m)   Wt 238 lb 9.6 oz (108.2 kg)   BMI 37.37 kg/m   Constitutional:  Well nourished. Alert and oriented, No acute distress. HEENT: [place] AT, moist mucus membranes.  Trachea midline, no masses. Cardiovascular: No clubbing, cyanosis, or edema. Respiratory: Normal respiratory effort, no increased work of breathing. GI: Abdomen is soft, non tender,  non distended, no abdominal masses. Liver and spleen not palpable.  No hernias appreciated.  Stool sample for occult testing is not indicated.   GU: No CVA tenderness.  No bladder fullness or masses.  Patient with uncircumcised phallus.  Foreskin easily retracted.   Urethral meatus is patent.  No penile discharge. No penile lesions or rashes. Scrotum without lesions, cysts, rashes and/or edema.  Testicles are located scrotally bilaterally. No masses are  appreciated in the testicles. Left and right epididymis are normal. Rectal: Patient with  normal sphincter tone. Anus and perineum without scarring or rashes. No rectal masses are appreciated. Prostate and seminal vesicles could not be palpated due to body habitus. Skin: No rashes, bruises or suspicious lesions. Lymph: No inguinal adenopathy. Neurologic: Grossly intact, no focal deficits, moving all 4 extremities. Psychiatric: Normal mood and affect.   Laboratory Data: PSA   1.60 in 02/2017  1.4 in 05/2018  1.19 in 03/2019  1.8 in 06/2019  No results found for: WBC, HGB, HCT, MCV, PLT  No results found for: CREATININE  No results found for: PSA  No results found for: TESTOSTERONE  No results found for: HGBA1C  Urinalysis No results found for: COLORURINE, APPEARANCEUR, LABSPEC, PHURINE, GLUCOSEU, HGBUR, BILIRUBINUR, KETONESUR, PROTEINUR, UROBILINOGEN, NITRITE, LEUKOCYTESUR   Assessment & Plan:    1. Prostate nodules I could no palpate the prostate today due to his body habitus.  I explained that since I am unable to feel the prostate nodule, I cannot assess for changes.  The PSA is relatively stable, but in a small amount of cases, prostate cancer will not change the value of the PSA.  I suggested he have a prostate MRI for further evaluation of his prostate to look for areas of concern.  He is agreeable.   I will call him with prostate MRI results.  2. BPH Asymptomatic. No treatment necessary.  Return for I will call patient with results.  Zara Council, PA-C  [facility] [address] [place], [place] [address] [phone]

## 2019-06-22 ENCOUNTER — Ambulatory Visit (INDEPENDENT_AMBULATORY_CARE_PROVIDER_SITE_OTHER): Payer: BC Managed Care – PPO | Admitting: Urology

## 2019-06-22 ENCOUNTER — Other Ambulatory Visit: Payer: Self-pay

## 2019-06-22 ENCOUNTER — Encounter: Payer: Self-pay | Admitting: Urology

## 2019-06-22 VITALS — BP 142/91 | HR 81 | Ht 67.0 in | Wt 238.6 lb

## 2019-06-22 DIAGNOSIS — N402 Nodular prostate without lower urinary tract symptoms: Secondary | ICD-10-CM

## 2019-06-22 DIAGNOSIS — N400 Enlarged prostate without lower urinary tract symptoms: Secondary | ICD-10-CM

## 2019-07-10 ENCOUNTER — Ambulatory Visit
Admission: RE | Admit: 2019-07-10 | Discharge: 2019-07-10 | Disposition: A | Discharge status: Home / Self Care | Payer: BC Managed Care – PPO | Source: Ambulatory Visit | Attending: Urology | Admitting: Urology

## 2019-07-10 ENCOUNTER — Other Ambulatory Visit: Payer: Self-pay

## 2019-07-10 DIAGNOSIS — N402 Nodular prostate without lower urinary tract symptoms: Secondary | ICD-10-CM | POA: Diagnosis not present

## 2019-07-10 LAB — POCT I-STAT CREATININE: Creatinine, Ser: 1.2 mg/dL (ref 0.61–1.24)

## 2019-07-10 MED ORDER — GADOBUTROL 1 MMOL/ML IV SOLN
10.0000 mL | Freq: Once | INTRAVENOUS | Status: AC | PRN
  Administered 2019-07-10: 10 mL via INTRAVENOUS

## 2019-07-12 ENCOUNTER — Telehealth: Payer: Self-pay

## 2019-07-12 NOTE — Telephone Encounter (Signed)
Left message for patient to call back regarding results and recommendations. °

## 2019-07-13 ENCOUNTER — Telehealth: Payer: Self-pay | Admitting: Urology

## 2019-07-13 NOTE — Telephone Encounter (Signed)
Patient called back and results were given Follow up appts made   [facility]

## 2019-12-31 IMAGING — US US EXTREM LOW VENOUS*L*
1 series · 13 of 24 positions shown · non-contrast
Comparison: None.

CLINICAL DATA: Left lower extremity swelling since earlier today.
Recent long car travel



[Series 1: us extrem low venous*left* · 0.08mm/px · 13 of 34 slices shown]
[im 1/34]
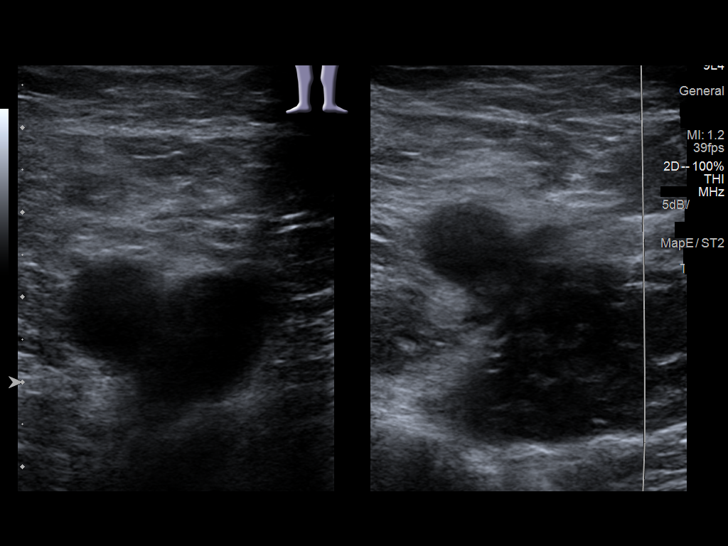
[im 3/34]
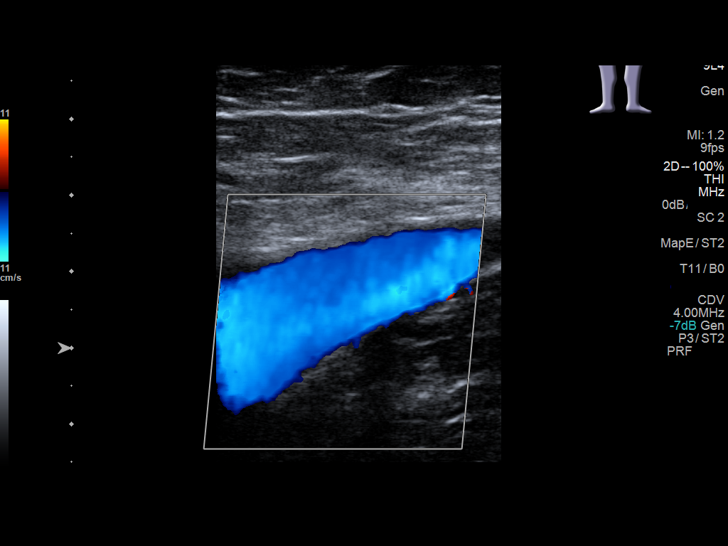
[im 6/34]
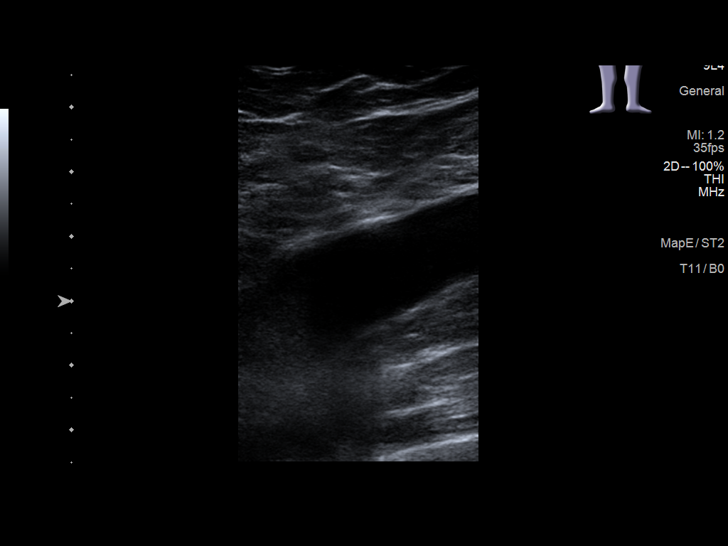
[im 9/34]
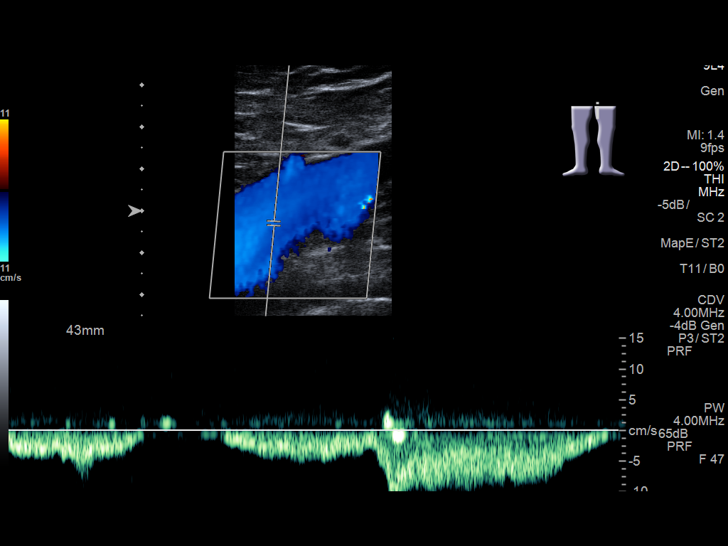
[im 12/34]
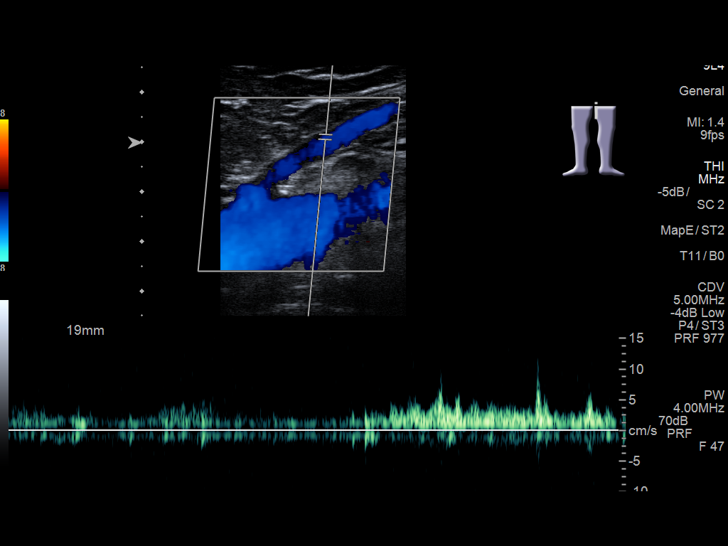
[im 15/34]
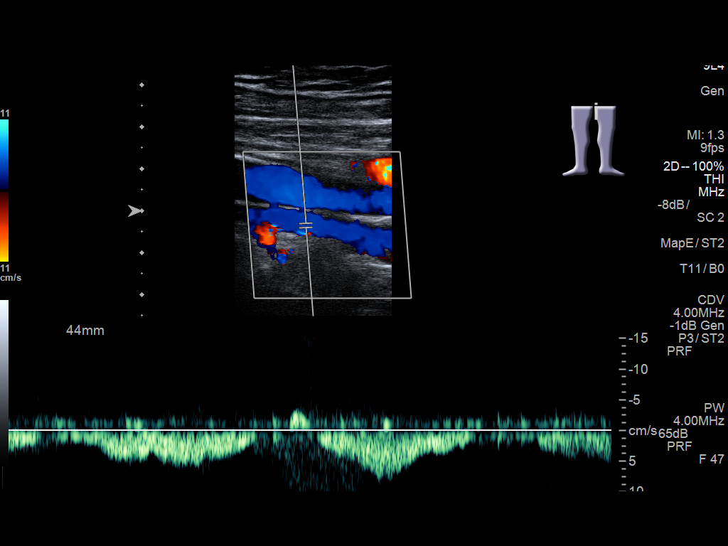
[im 18/34]
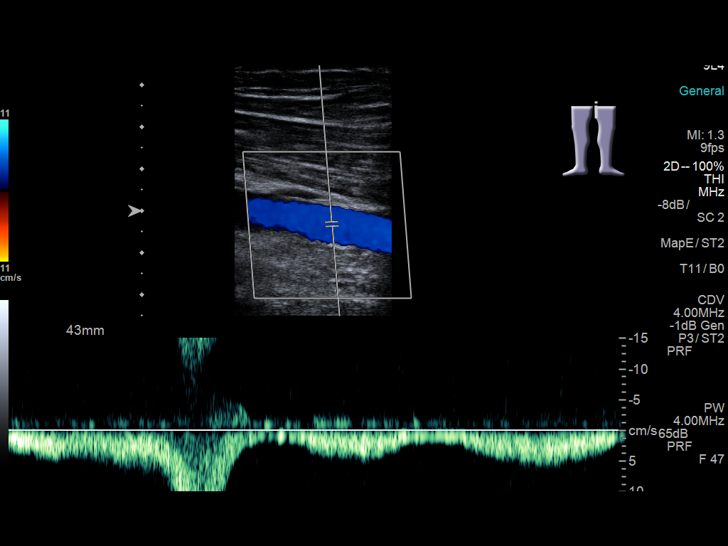
[im 19/34]
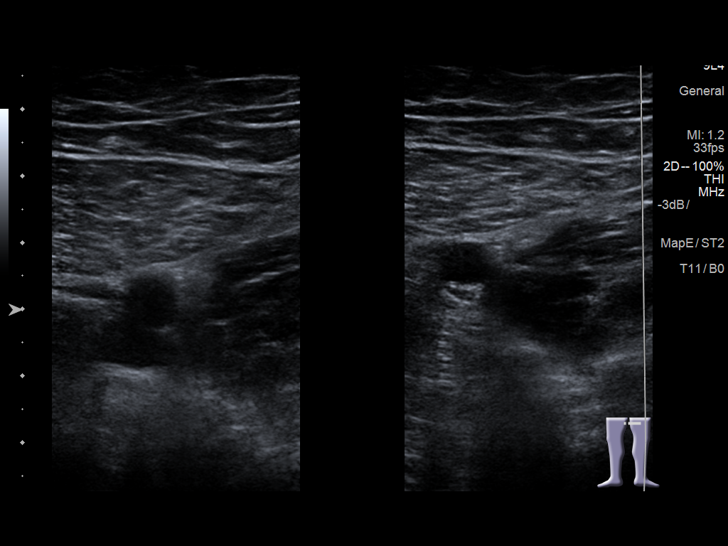
[im 22/34]
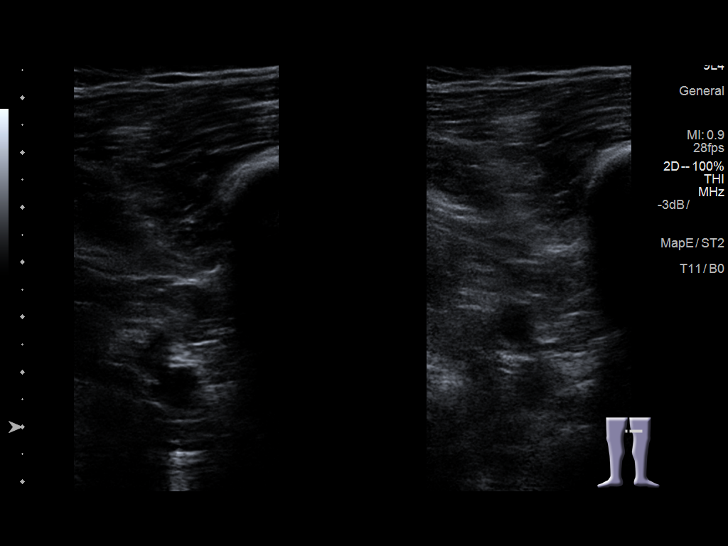
[im 25/34]
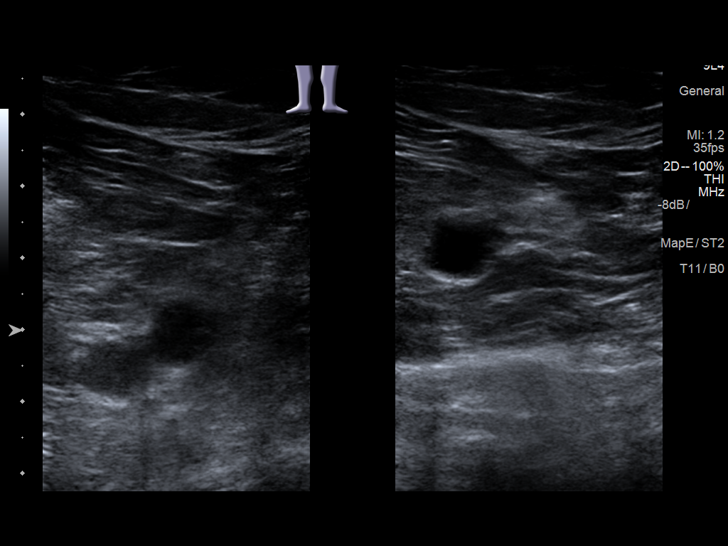
[im 28/34]
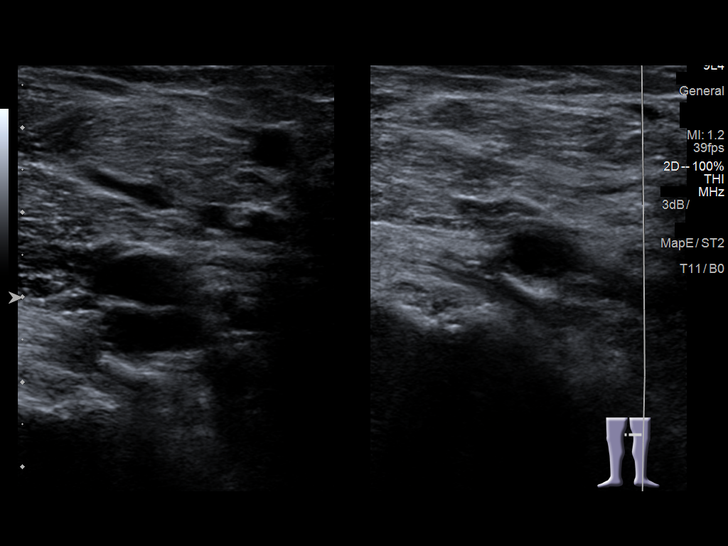
[im 31/34]
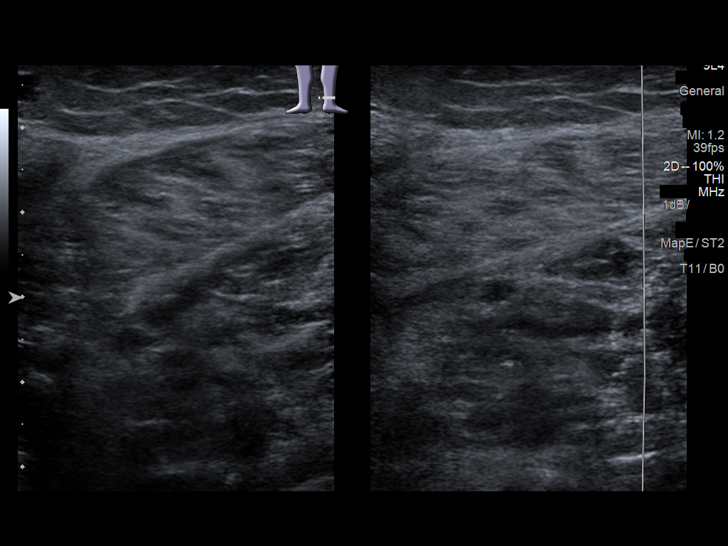
[im 34/34]
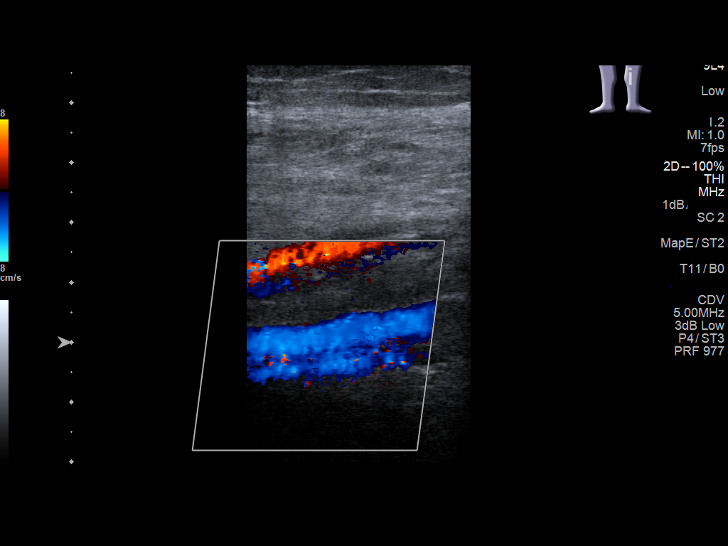

[13 of 24 positions shown; findings below may reference images not displayed]

FINDINGS: Contralateral Common Femoral Vein: Respiratory phasicity is normal
and symmetric with the symptomatic side. No evidence of thrombus.
Normal compressibility.

Common Femoral Vein: No evidence of thrombus. Normal
compressibility, respiratory phasicity and response to augmentation.

Saphenofemoral Junction: No evidence of thrombus. Normal
compressibility and flow on color Doppler imaging.

Profunda Femoral Vein: No evidence of thrombus. Normal
compressibility and flow on color Doppler imaging.

Femoral Vein: No evidence of thrombus. Normal compressibility,
respiratory phasicity and response to augmentation.

Popliteal Vein: No evidence of thrombus. Normal compressibility,
respiratory phasicity and response to augmentation.

Calf Veins: No evidence of thrombus. Normal compressibility and flow
on color Doppler imaging.

Superficial Great Saphenous Vein: No evidence of thrombus. Normal
compressibility.

Venous Reflux:  None.

Other Findings:  None.
IMPRESSION: No evidence of deep venous thrombosis.

## 2020-07-07 ENCOUNTER — Other Ambulatory Visit: Payer: Self-pay

## 2020-07-07 ENCOUNTER — Other Ambulatory Visit: Payer: BC Managed Care – PPO

## 2020-07-07 DIAGNOSIS — N402 Nodular prostate without lower urinary tract symptoms: Secondary | ICD-10-CM

## 2020-07-07 NOTE — Addendum Note (Signed)
Addended by: Maryln Gottron on: 07/07/2020 10:52 AM   Modules accepted: Orders

## 2020-07-08 LAB — PSA: Prostate Specific Ag, Serum: 2.9 ng/mL (ref 0.0–4.0)

## 2020-07-10 NOTE — Progress Notes (Signed)
07/11/2020 9:18 PM   [name] Dec 06, 1960 [id number]  Referring provider: Valera Castle, [place],  [place] [address] Chief Complaint  Patient presents with  . Benign Prostatic Hypertrophy    HPI: Jared Avery is a 59 y.o. male who returns for a 1 year follow up of BPH and prostate nodules.   His was followed by another urologist for this in the past.    His PSAs have always been normal. PSA was 1.4 in  05/2018. He does have a negative prostate biopsy performed by Dr. Madelin Headings approximately 10 years ago for the nodules on his prostate. Recent PSA was 2.9.   During the last visit he had no urinary complaints.  Patient denied any gross hematuria, dysuria or suprapubic/flank pain.  Patient denied any fevers, chills, nausea or vomiting.   He had no erectile dysfunction.  He denied any curvature or pain with erections.    Today he notes on and off urgency and frequency. Urinary symptoms are worse when he is driving. No hematuria or dysuria.   Denies erectile dysfunction. No pain with erections.   He does have a family history of prostate cancer in his father who ended up passing away from lung cancer.   Patient used to be a light smoker. He smoked cigars on occasions.   PSA trend: Component     Latest Ref Rng & Units 06/15/2018 06/18/2019 07/07/2020  Prostate Specific Ag, Serum     0.0 - 4.0 ng/mL 1.4 1.8 2.9    PMH: Past Medical History:  Diagnosis Date  . Leukemia [facility])     Surgical History: Past Surgical History:  Procedure Laterality Date  . CYST REMOVAL HAND      Home Medications:  Allergies as of 07/11/2020   No Known Allergies     Medication List       Accurate as of July 11, 2020 11:59 PM. If you have any questions, ask your nurse or doctor.        amLODipine 5 MG tablet Commonly known as: NORVASC Take by mouth.       Allergies: No Known Allergies  Family History: Family History  Problem Relation Age of Onset   . Prostate cancer Father   . Bladder Cancer Neg Hx   . Kidney cancer Neg Hx     Social History:  reports that he has never smoked. He has never used smokeless tobacco. He reports that he does not drink alcohol. No history on file for drug use.   Physical Exam: BP 139/83   Pulse 98   Ht 5\' 7"  (1.702 m)   Wt 235 lb 1.6 oz (106.6 kg)   BMI 36.82 kg/m   Constitutional:  Alert and oriented, No acute distress. HEENT: [place] AT, moist mucus membranes.  Trachea midline, no masses. Cardiovascular: No clubbing, cyanosis, or edema. Respiratory: Normal respiratory effort, no increased work of breathing. GI: Abdomen is soft, nontender, nondistended, no abdominal masses GU: No CVA tenderness.  No bladder fullness or masses.  Patient with uncircumcised phallus. Foreskin easily retracted  Urethral meatus is patent.  No penile discharge. No penile lesions or rashes. Scrotum without lesions, cysts, rashes and/or edema.  Testicles are located scrotally bilaterally. No masses are appreciated in the testicles. Left and right epididymis are normal. Rectal: Patient with  normal sphincter tone. Anus and perineum without scarring or rashes. No rectal masses are appreciated. Palpate apex only, 5 mm nodule appreciated in right apex. Seminal vesicles  are normal. Lymph: No cervical or inguinal lymphadenopathy. Skin: No rashes, bruises or suspicious lesions. Neurologic: Grossly intact, no focal deficits, moving all 4 extremities. Psychiatric: Normal mood and affect.  Laboratory Data:  Urinalysis Component     Latest Ref Rng & Units 07/11/2020  Specific Gravity, UA     1.005 - 1.030 >1.030 (H)  pH, UA     5.0 - 7.5 5.0  Color, UA     Yellow Yellow  Appearance Ur     Clear Clear  Leukocytes,UA     Negative Negative  Protein,UA     Negative/Trace Trace (A)  Glucose, UA     Negative Negative  Ketones, UA     Negative Negative  RBC, UA     Negative 2+ (A)  Bilirubin, UA     Negative Negative    Urobilinogen, Ur     0.2 - 1.0 mg/dL 0.2  Nitrite, UA     Negative Negative  Microscopic Examination      See below:   Component     Latest Ref Rng & Units 07/11/2020  WBC, UA     0 - 5 /hpf 0-5  RBC     0 - 2 /hpf 3-10 (A)  Epithelial Cells (non renal)     0 - 10 /hpf 0-10  Casts     None seen /lpf Present (A)  Cast Type     N/A Granular casts (A)  Mucus, UA     Not Estab. Present (A)  Bacteria, UA     None seen/Few None seen    I have reviewed the labs.  Assessment & Plan:    1. Prostate nodules  PSA has increased >0.75 over the last calendar year Explained that this is concerning and may represent prostate cancer, at this time we could repeat the PSA in one month to see if it returns to baseline, obtain a prostate MRI or undergo a repeat biopsy He would like to repeat his PSA in month RTC in 1 month for PSA.  2. BPH  Major complaints are urgency and frequency UA with micro heme - culture pending  3. Microscopic hematuria  UA with micro heme Urine culture sent - if negative will need to undergo a hematuria work up  RTC in 1 month for UA.   Return in about 1 month (around 08/11/2020) for psa only - hematuria work up if culture is negative .  [facility] [address] [place], [place] [address] [phone]  I, Selena Batten, am acting as a scribe for [employer],  I have reviewed the above documentation for accuracy and completeness, and I agree with the above.    Zara Council, PA-C

## 2020-07-11 ENCOUNTER — Ambulatory Visit (INDEPENDENT_AMBULATORY_CARE_PROVIDER_SITE_OTHER): Payer: BC Managed Care – PPO | Admitting: Urology

## 2020-07-11 ENCOUNTER — Encounter: Payer: Self-pay | Admitting: Urology

## 2020-07-11 ENCOUNTER — Other Ambulatory Visit: Payer: Self-pay

## 2020-07-11 VITALS — BP 139/83 | HR 98 | Ht 67.0 in | Wt 235.1 lb

## 2020-07-11 DIAGNOSIS — N402 Nodular prostate without lower urinary tract symptoms: Secondary | ICD-10-CM | POA: Diagnosis not present

## 2020-07-11 DIAGNOSIS — R3129 Other microscopic hematuria: Secondary | ICD-10-CM | POA: Diagnosis not present

## 2020-07-11 DIAGNOSIS — N400 Enlarged prostate without lower urinary tract symptoms: Secondary | ICD-10-CM | POA: Diagnosis not present

## 2020-07-12 LAB — URINALYSIS, COMPLETE
Bilirubin, UA: NEGATIVE
Glucose, UA: NEGATIVE
Ketones, UA: NEGATIVE
Leukocytes,UA: NEGATIVE
Nitrite, UA: NEGATIVE
Specific Gravity, UA: >1.03 — ABNORMAL HIGH (ref 1.005–1.030)
Urobilinogen, Ur: 0.2 mg/dL (ref 0.2–1.0)
pH, UA: 5 (ref 5.0–7.5)

## 2020-07-12 LAB — MICROSCOPIC EXAMINATION: Bacteria, UA: NONE SEEN

## 2020-07-13 LAB — CULTURE, URINE COMPREHENSIVE

## 2020-07-14 LAB — CULTURE, URINE COMPREHENSIVE

## 2020-07-17 ENCOUNTER — Telehealth: Payer: Self-pay | Admitting: Family Medicine

## 2020-07-17 DIAGNOSIS — R3129 Other microscopic hematuria: Secondary | ICD-10-CM

## 2020-07-17 NOTE — Telephone Encounter (Signed)
-----   Message from Nori Riis, PA-C sent at 07/16/2020  8:49 PM EDT ----- Please let Jared Avery know that his urine culture was negative for infection, so we will need to run more tests to see where the micro heme came from.  We need to get him scheduled for a RUS and cystoscopy.

## 2020-07-17 NOTE — Telephone Encounter (Signed)
Patient notified and Cysto appointment made. Order for RUS entered.

## 2020-08-28 ENCOUNTER — Ambulatory Visit (INDEPENDENT_AMBULATORY_CARE_PROVIDER_SITE_OTHER): Payer: BC Managed Care – PPO | Admitting: Urology

## 2020-08-28 ENCOUNTER — Encounter: Payer: Self-pay | Admitting: Urology

## 2020-08-28 ENCOUNTER — Other Ambulatory Visit: Payer: Self-pay

## 2020-08-28 VITALS — BP 128/84 | HR 90 | Ht 67.0 in | Wt 238.0 lb

## 2020-08-28 DIAGNOSIS — R3129 Other microscopic hematuria: Secondary | ICD-10-CM

## 2020-08-28 NOTE — Progress Notes (Signed)
   08/28/20  CC:  Chief Complaint  Patient presents with  . Cysto    HPI: Refer to Shannon's note 07/11/2020  Blood pressure 128/84, pulse 90, height 5\' 7"  (1.702 m), weight 238 lb (108 kg). NED. A&Ox3.   No respiratory distress   Abd soft, NT, ND Normal phallus with bilateral descended testicles  Cystoscopy Procedure Note  Patient identification was confirmed, informed consent was obtained, and patient was prepped using Betadine solution.  Lidocaine jelly was administered per urethral meatus.     Pre-Procedure: - Inspection reveals a normal caliber urethral meatus.  Procedure: The flexible cystoscope was introduced without difficulty - No urethral strictures/lesions are present. - Mild lateral lobe enlargement prostate  - Mild elevation bladder neck - Bilateral ureteral orifices identified - Bladder mucosa  reveals no ulcers, tumors, or lesions - No bladder stones - No trabeculation  Retroflexion shows no abnormalities   Post-Procedure: - Patient tolerated the procedure well  Assessment/ Plan:  No mucosal abnormalities noted on cystoscopy, mild BPH  UA today 3-10 RBC  Renal ultrasound was ordered however has not been scheduled and will have admin staff check on status    Abbie Sons, MD

## 2020-08-29 LAB — MICROSCOPIC EXAMINATION
Bacteria, UA: NONE SEEN
Epithelial Cells (non renal): NONE SEEN /hpf (ref 0–10)

## 2020-08-29 LAB — URINALYSIS, COMPLETE
Bilirubin, UA: NEGATIVE
Glucose, UA: NEGATIVE
Ketones, UA: NEGATIVE
Leukocytes,UA: NEGATIVE
Nitrite, UA: NEGATIVE
Protein,UA: NEGATIVE
Specific Gravity, UA: 1.025 (ref 1.005–1.030)
Urobilinogen, Ur: 0.2 mg/dL (ref 0.2–1.0)
pH, UA: 6 (ref 5.0–7.5)

## 2020-09-05 ENCOUNTER — Ambulatory Visit
Admission: RE | Admit: 2020-09-05 | Discharge: 2020-09-05 | Disposition: A | Discharge status: Home / Self Care | Payer: BC Managed Care – PPO | Source: Ambulatory Visit | Attending: Urology | Admitting: Urology

## 2020-09-05 ENCOUNTER — Other Ambulatory Visit: Payer: Self-pay

## 2020-09-05 DIAGNOSIS — R3129 Other microscopic hematuria: Secondary | ICD-10-CM | POA: Diagnosis not present

## 2020-09-06 ENCOUNTER — Telehealth: Payer: Self-pay | Admitting: Family Medicine

## 2020-09-06 NOTE — Telephone Encounter (Signed)
-----   Message from Nori Riis, PA-C sent at 09/06/2020  1:37 PM EST ----- Please let Jared Avery know that his kidney ultrasound was negative for cancer.  We need to see him in 6 months for UA, PSA, I PSS and exam - lab work prior to his appointment

## 2020-09-06 NOTE — Telephone Encounter (Signed)
LMOM for patient to return call.

## 2020-09-07 NOTE — Telephone Encounter (Signed)
Patient informed, labs scheduled and follow up. Patient voiced understanding.

## 2021-01-03 IMAGING — US US RENAL
1 series · 14 of 25 positions shown · non-contrast
Comparison: Prior CT from 10/11/2006.

CLINICAL DATA: Initial evaluation for microscopic hematuria.

EXAM:
RENAL / URINARY TRACT ULTRASOUND COMPLETE

[Series 1: us renal · 14 of 45 slices shown]
[im 1/45]
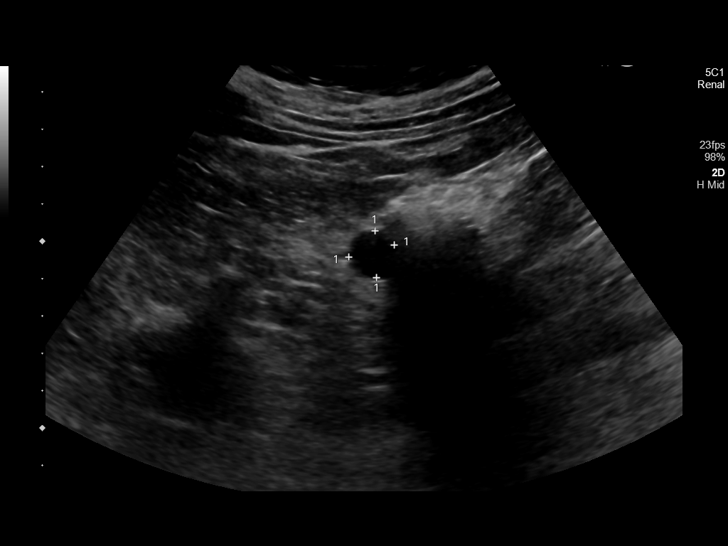
[im 4/45]
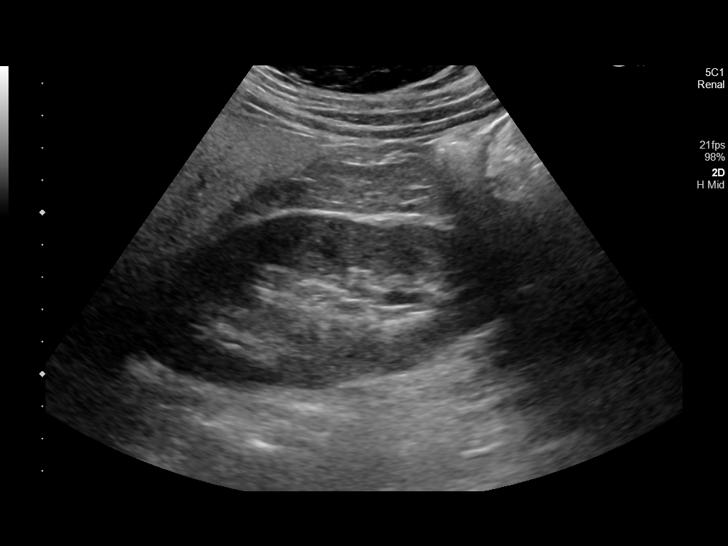
[im 8/45]
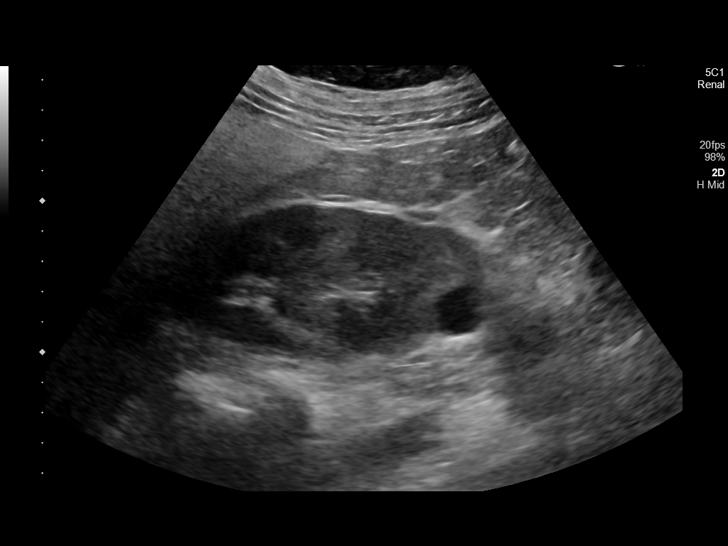
[im 12/45]
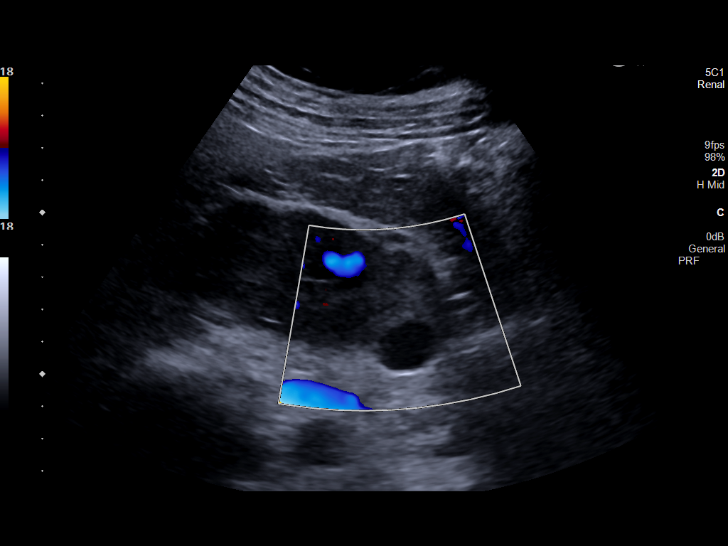
[im 15/45]
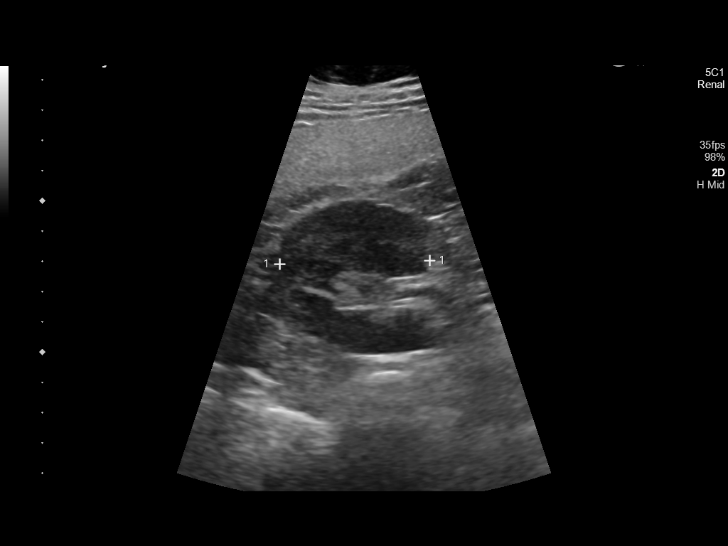
[im 17/45]
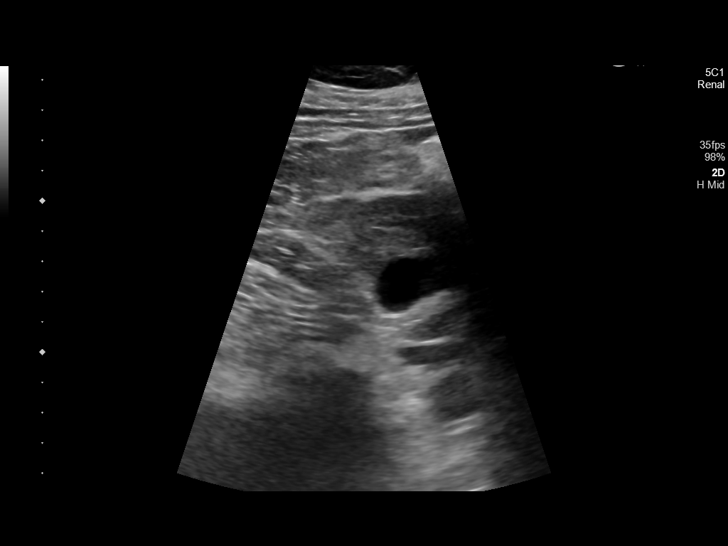
[im 21/45]
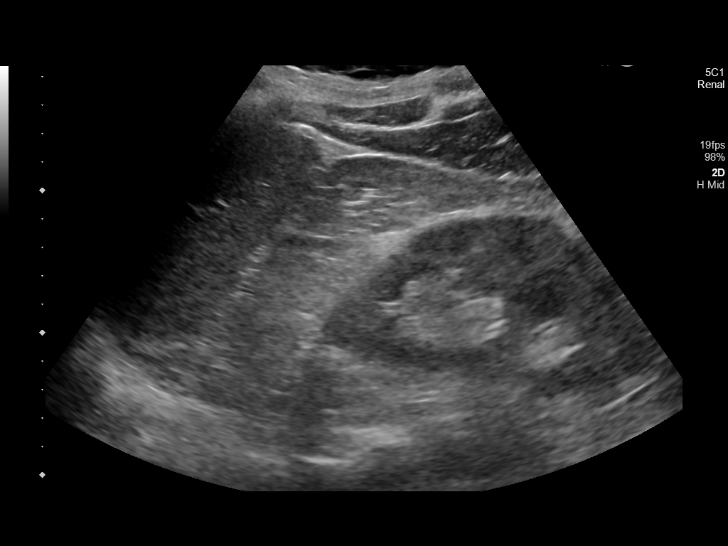
[im 24/45]
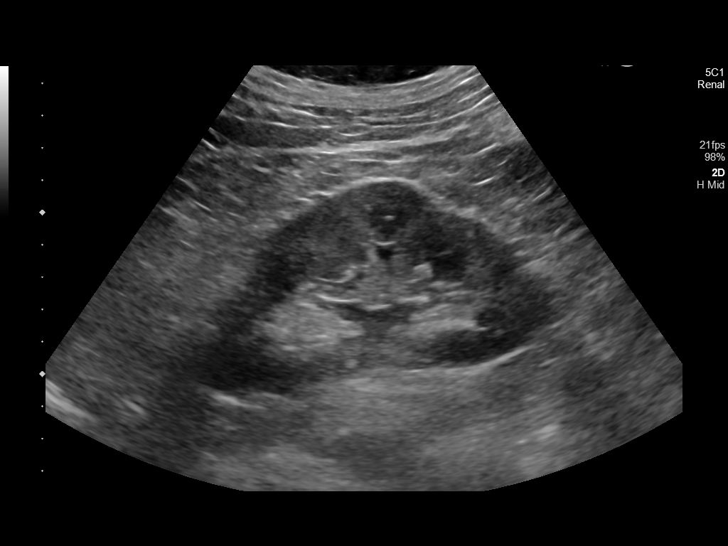
[im 28/45]
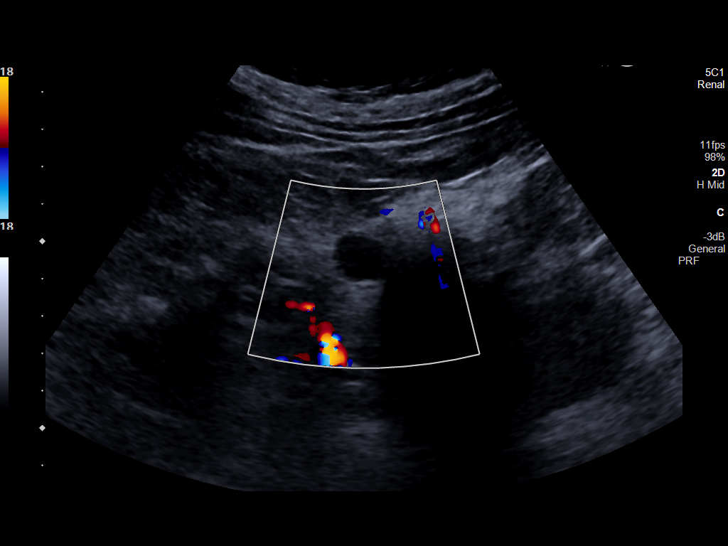
[im 30/45]
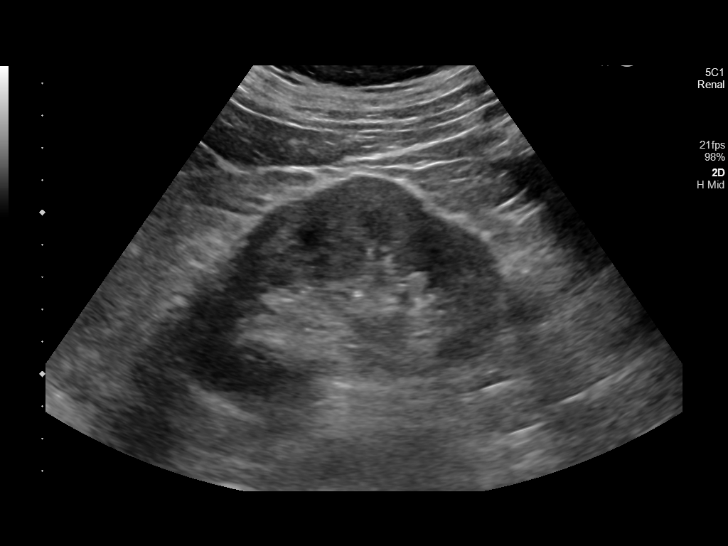
[im 34/45]
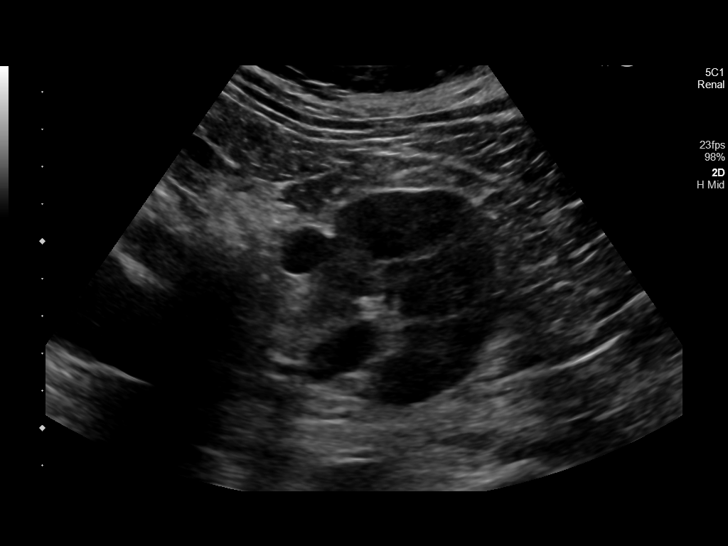
[im 37/45]
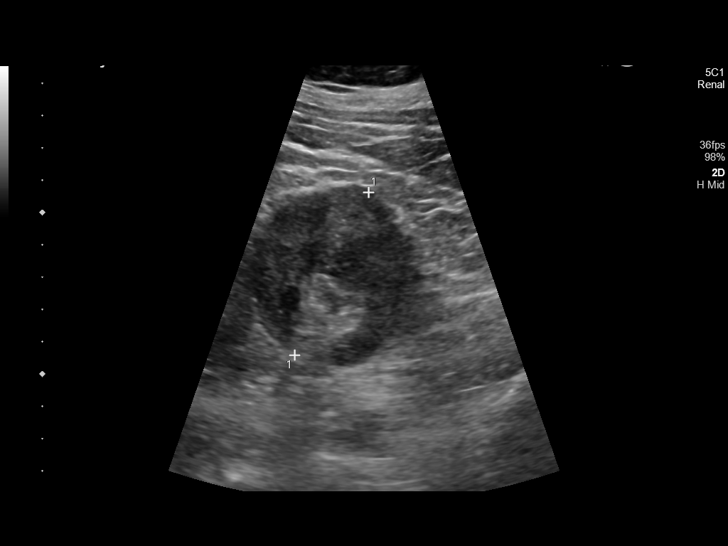
[im 41/45]
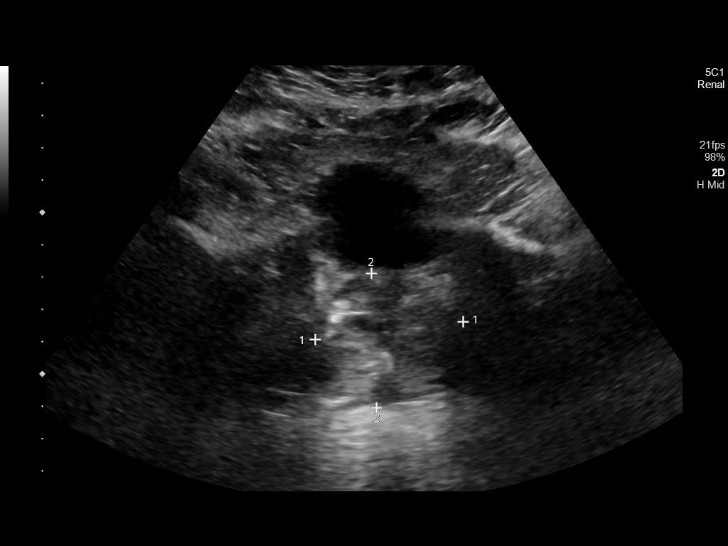
[im 45/45]
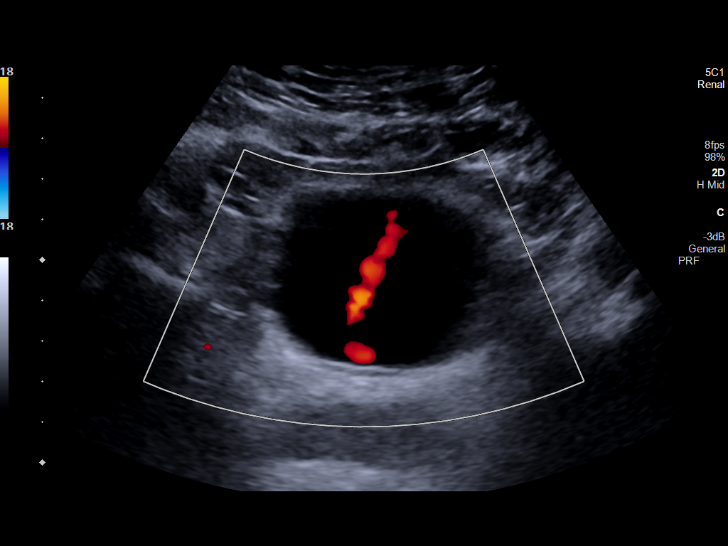

[14 of 25 positions shown; findings below may reference images not displayed]

FINDINGS: Right Kidney:

Renal measurements: 11.8 x 6.0 x 5.0 cm = volume: 185.3 mL. Renal
echogenicity within normal limits. No shadowing echogenic calculi.
No hydronephrosis. 1.3 x 1.6 x 1.5 cm simple cyst present at the
lower pole.

Left Kidney:

Renal measurements: 11.4 x 5.9 x 5.5 cm = volume: 194.8 mL. Renal
echogenicity within normal limits. No shadowing echogenic calculi.
No hydronephrosis. 1.3 x 1.4 x 1.3 cm simple cyst present at the
interpolar region.

Bladder:

Appears normal for degree of bladder distention.

Other:

Dystrophic calcifications noted within the prostate.
IMPRESSION: 1. No sonographic evidence for nephrolithiasis, hydronephrosis, or
other abnormality.
2. Few small simple bilateral renal cysts as above.

## 2021-01-16 ENCOUNTER — Other Ambulatory Visit: Payer: Self-pay | Admitting: Family Medicine

## 2021-01-16 DIAGNOSIS — N400 Enlarged prostate without lower urinary tract symptoms: Secondary | ICD-10-CM

## 2021-03-08 ENCOUNTER — Other Ambulatory Visit: Payer: Self-pay

## 2021-03-09 ENCOUNTER — Encounter: Payer: Self-pay | Admitting: Urology

## 2021-03-13 NOTE — Progress Notes (Deleted)
07/11/2020 8:15 AM   Jared Avery 05/18/1961 [id number]  Referring provider: Valera Castle, [place],  [place] [address] No chief complaint on file.  Urological history: 1. Prostate nodule -PSA 2.14 in 12/2020 -found incidentally on exam -biopsied > 10 years ago - negative -prostate mri 2020 - negative   2. Intermediate risk hematuria -former smoker -RUS 2021 bilateral cysts -cysto 2021 - Mild lateral lobe enlargement prostate -UA ***  3. Bilateral renal cysts -seen on 2021 RUS   4. BPH with LU TS -I PSS *** -PVR ***  5. Family history of prostate cancer -father with non-fatal prostate cancer   HPI: Jared Avery is a 60 y.o. male who presents for a 6 month follow up.         PMH: Past Medical History:  Diagnosis Date   Leukemia [facility]     Surgical History: Past Surgical History:  Procedure Laterality Date   CYST REMOVAL HAND      Home Medications:  Allergies as of 03/14/2021   No Known Allergies      Medication List        Accurate as of March 13, 2021  8:15 AM. If you have any questions, ask your nurse or doctor.          amLODipine 5 MG tablet Commonly known as: NORVASC Take by mouth.        Allergies: No Known Allergies  Family History: Family History  Problem Relation Age of Onset   Prostate cancer Father    Bladder Cancer Neg Hx    Kidney cancer Neg Hx     Social History:  reports that he has never smoked. He has never used smokeless tobacco. He reports that he does not drink alcohol. No history on file for drug use.   Physical Exam: There were no vitals taken for this visit.  Constitutional:  Alert and oriented, No acute distress. HEENT: [place] AT, moist mucus membranes.  Trachea midline, no masses. Cardiovascular: No clubbing, cyanosis, or edema. Respiratory: Normal respiratory effort, no increased work of breathing. GI: Abdomen is soft, nontender, nondistended, no abdominal masses GU: No  CVA tenderness.  No bladder fullness or masses.  Patient with uncircumcised phallus. Foreskin easily retracted  Urethral meatus is patent.  No penile discharge. No penile lesions or rashes. Scrotum without lesions, cysts, rashes and/or edema.  Testicles are located scrotally bilaterally. No masses are appreciated in the testicles. Left and right epididymis are normal. Rectal: Patient with  normal sphincter tone. Anus and perineum without scarring or rashes. No rectal masses are appreciated. Palpate apex only, 5 mm nodule appreciated in right apex. Seminal vesicles are normal. Lymph: No cervical or inguinal lymphadenopathy. Skin: No rashes, bruises or suspicious lesions. Neurologic: Grossly intact, no focal deficits, moving all 4 extremities. Psychiatric: Normal mood and affect.  Laboratory Data: PSA (Prostate Specific Antigen), Total <=2.99 ng/mL 2.14   Comment: [place] PSA Screening algorithm, based on a multi-disciplinary consensus panel review of best reported practice in the literature.  All recommendations and treatment decisions should be made in conjunction with the patient after discussion and counseling.   If PSA >= 3.0 ng/ml, consider referral to Urology  If PSA <  3.0 ng/ml, consider screening every two years   [employer] Total-PSA Method:   The measured value of this analyte can vary depending upon the testing procedure used. Values determined on patient samples by differing testing procedures cannot be directly compared with  one another, and could be cause of erroneous medical interpretation.  Resulting Agency  [facility]  Specimen Collected: 01/16/21 08:54 Last Resulted: 01/16/21 17:02  Received From: [facility]  Result Received: 03/08/21 07:56   Component     Latest Ref Rng & Units 06/18/2019 07/07/2020  Prostate Specific Ag, Serum     0.0 - 4.0 ng/mL 1.8 2.9   PSA (Prostate Specific Antigen), Total <=2.99 ng/mL 1.19    Comment: [facility] PSA Screening algorithm, based on a multi-disciplinary consensus panel review of best reported practice in the literature.  All recommendations and treatment decisions should be made in conjunction with the patient after discussion and counseling.   If PSA >= 3.0 ng/ml, consider referral to Urology  If PSA <  3.0 ng/ml, consider screening every two years   [employer] Total-PSA Method:   The measured value of this analyte can vary depending upon the testing procedure used. Values determined on patient samples by differing testing procedures cannot be directly compared with one another, and could be cause of erroneous medical interpretation.  Resulting Agency  [facility]  Specimen Collected: 03/23/19 09:46 Last Resulted: 03/23/19 16:19  Received From: [facility]  Result Received: 01/03/21 10:38   Component     Latest Ref Rng & Units 06/15/2018  Prostate Specific Ag, Serum     0.0 - 4.0 ng/mL 1.4   Hemoglobin A1C <6.5 % 6.3   Average Blood Glucose (Calculated From HgBA1c Level) mg/dL 134   Resulting Agency  [facility]   Narrative Performed by [facility] The ADA has made the following Recommendations:  HBA1C results between 5.7% and 6.4% are suggestive of prediabetes  HBA1C result greater than or equal to 6.5% are diagnostic of diabetes Specimen Collected: 01/16/21 08:54 Last Resulted: 01/16/21 17:01  Received From: [facility]  Result Received: 03/08/21 07:56   Sodium 135 - 145 mmol/L 139   Potassium 3.5 - 5.0 mmol/L 3.8   Chloride 98 - 108 mmol/L 106   Carbon Dioxide (CO2) 21 - 30 mmol/L 24   Urea Nitrogen (BUN) 7 - 20 mg/dL 20   Creatinine 0.6 - 1.3 mg/dL 1.1   Glucose 70 - 140 mg/dL 99   Comment: Interpretive Data:  Above is the NONFASTING reference range.   Below are the FASTING reference ranges:  NORMAL:      70-99 mg/dL   PREDIABETES: 100-125 mg/dL  DIABETES:    > 125 mg/dL   Calcium 8.7 - 10.2 mg/dL 9.2   Anion Gap 3 - 12 mmol/L 9   BUN/CREA Ratio 6 - 27 18   Glomerular Filtration Rate (eGFR)  mL/min/1.73sq m 77   Comment: CKD-EPI (2021) does not include patient's race in the calculation of eGFR. Monitoring changes of plasma creatinine and eGFR over time is useful for monitoring kidney function.  This change was made on 11/28/2020.   Interpretive Ranges for eGFR(CKD-EPI 2021):   eGFR:              > 60 mL/min/1.73 sq m - Normal  eGFR:              30 - 59 mL/min/1.73 sq m - Moderately Decreased  eGFR:              15 - 29 mL/min/1.73 sq m - Severely Decreased  eGFR:              <  15 mL/min/1.73 sq m -  Kidney Failure    Note: These eGFR calculations do not apply in acute situations  when eGFR is changing rapidly or in patients on dialysis.   Resulting Agency  [facility]  Specimen Collected: 01/16/21 08:54 Last Resulted: 01/16/21 19:33  Received From: [facility]  Result Received: 03/08/21 07:56   WBC (White Blood Cell Count) 3.2 - 9.8 x10^9/L 8.6   Hemoglobin 13.7 - 17.3 g/dL 14.8   Hematocrit 39.0 - 49.0 % 42.4   Platelets 150 - 450 x10^9/L 259   MCV (Mean Corpuscular Volume) 80 - 98 fL 87   MCH (Mean Corpuscular Hemoglobin) 26.5 - 34.0 pg 30.5   MCHC (Mean Corpuscular Hemoglobin Concentration) 31.5 - 36.3 % 34.9   RBC (Red Blood Cell Count) 4.37 - 5.74 x10^12/L 4.85   RDW-CV (Red Cell Distribution Width) 11.5 - 14.5 % 12.8   MPV (Mean Platelet Volume) 7.2 - 11.7 fL 10.7   Neutrophil Count 2.0 - 8.6 x10^9/L 5.3   Neutrophil % 37 - 80 % 61.6   Lymphocyte Count 0.6 - 4.2 x10^9/L 2.4   Lymphocyte % 10 - 50 % 27.7   Monocyte Count 0 - 0.9 x10^9/L 0.6   Monocyte % 0 - 12 % 7.3   Eosinophil Count 0 - 0.70 x10^9/L 0.23   Eosinophil % 0 - 7 % 2.7   Basophil Count 0 - 0.20 x10^9/L 0.06   Basophil % 0 - 2 % 0.7   Resulting Agency  [facility]   Specimen Collected: 01/16/21 08:54 Last Resulted: 01/16/21 10:04  Received From: [facility]  Result Received: 01/16/21 15:23    Urinalysis ***   I have reviewed the labs.  Assessment & Plan:    1. Prostate nodules  PSA has increased >0.75 over the last calendar year Explained that this is concerning and may represent prostate cancer, at this time we could repeat the PSA in one month to see if it returns to baseline, obtain a prostate MRI or undergo a repeat biopsy He would like to repeat his PSA in month RTC in 1 month for PSA.  2. BPH  Major complaints are urgency and frequency UA with micro heme - culture pending  3. Microscopic hematuria  UA with micro heme Urine culture sent - if negative will need to undergo a hematuria work up  RTC in 1 month for UA.   No follow-ups on file.  [facility] [address] [place], [place] [address] [phone]

## 2021-03-14 ENCOUNTER — Ambulatory Visit: Payer: Self-pay | Admitting: Urology

## 2021-03-16 ENCOUNTER — Encounter: Payer: Self-pay | Admitting: Urology

## 2021-05-24 ENCOUNTER — Encounter: Payer: Self-pay | Admitting: Urology

## 2021-06-18 ENCOUNTER — Telehealth: Payer: Self-pay | Admitting: Urology

## 2021-06-18 NOTE — Telephone Encounter (Signed)
Please call Mr. Jared Avery and have him reschedule his missed appointment from June for micro heme.

## 2021-09-04 ENCOUNTER — Encounter: Payer: Self-pay | Admitting: Urology

## 2021-09-04 NOTE — Telephone Encounter (Signed)
Good morning. I have made multiple attempts to get in touch with Jared Avery, including this morning, and he has not responded back to any messages that I have left him.

## 2022-03-20 ENCOUNTER — Encounter: Payer: Self-pay | Admitting: Urology

## 2022-04-04 ENCOUNTER — Encounter: Payer: Self-pay | Admitting: Emergency Medicine

## 2022-04-04 ENCOUNTER — Other Ambulatory Visit: Payer: Self-pay

## 2022-04-04 ENCOUNTER — Emergency Department
Admission: EM | Admit: 2022-04-04 | Discharge: 2022-04-04 | Disposition: A | Discharge status: Home / Self Care | Payer: BC Managed Care – PPO | Attending: Emergency Medicine | Admitting: Emergency Medicine

## 2022-04-04 DIAGNOSIS — Z79899 Other long term (current) drug therapy: Secondary | ICD-10-CM | POA: Insufficient documentation

## 2022-04-04 DIAGNOSIS — M109 Gout, unspecified: Secondary | ICD-10-CM | POA: Diagnosis not present

## 2022-04-04 DIAGNOSIS — I10 Essential (primary) hypertension: Secondary | ICD-10-CM | POA: Diagnosis not present

## 2022-04-04 DIAGNOSIS — M79672 Pain in left foot: Secondary | ICD-10-CM | POA: Diagnosis present

## 2022-04-04 LAB — URIC ACID: Uric Acid, Serum: 9.2 mg/dL — ABNORMAL HIGH (ref 3.7–8.6)

## 2022-04-04 MED ORDER — NAPROXEN 500 MG PO TABS: 500.0000 mg | ORAL_TABLET | Freq: Two times a day (BID) | ORAL | 0 refills | Status: AC

## 2022-04-04 MED ORDER — OXYCODONE-ACETAMINOPHEN 5-325 MG PO TABS: 1.0000 | ORAL_TABLET | ORAL | 0 refills | Status: AC | PRN

## 2022-04-04 MED ORDER — KETOROLAC TROMETHAMINE 60 MG/2ML IM SOLN
30.0000 mg | Freq: Once | INTRAMUSCULAR | Status: AC
  Administered 2022-04-04: 30 mg via INTRAMUSCULAR
  Filled 2022-04-04: qty 2

## 2022-04-04 NOTE — ED Triage Notes (Signed)
Patient ambulatory to triage with limping gait, without distress noted; reports left ankle/foot pain & swelling; st hx of same with gout and on no meds for such

## 2022-04-04 NOTE — Discharge Instructions (Addendum)
You may take take pain medicines as needed (Naprosyn/Percocet).  Wear podiatric shoe and use crutches as needed.  Return to the ER for worsening symptoms, persistent vomiting, difficulty breathing or other concerns.

## 2022-04-04 NOTE — ED Provider Notes (Signed)
[facility] Provider Note    Event Date/Time   First MD Initiated Contact with Patient 04/04/22 [phone]     (approximate)   History   Ankle Pain   HPI  Jared Avery is a 61 y.o. male who presents to the ED from home with a chief complaint of left foot pain and swelling x1 day.  History of gout and states this feels similarly.  Denies fall/trauma/injury.  Voices no other complaints or injuries.     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 Authorizing Provider  amLODipine (NORVASC) 5 MG tablet Take by mouth. 05/26/19 08/28/20  [provider]     Allergies  Patient has no known allergies.   Family History   Family History  Problem Relation Age of Onset   Prostate cancer Father    Bladder Cancer Neg Hx    Kidney cancer Neg Hx      Physical Exam  Triage Vital Signs: ED Triage Vitals  Enc Vitals Group     BP 04/04/22 0129 (!) 156/105     Pulse Rate 04/04/22 0129 92     Resp 04/04/22 0129 20     Temp 04/04/22 0129 97.9 F (36.6 C)     Temp Source 04/04/22 0129 Oral     SpO2 04/04/22 0129 98 %     Weight 04/04/22 0122 243 lb (110.2 kg)     Height 04/04/22 0122 '5\' 6"'$  (1.676 m)     Head Circumference --      Peak Flow --      Pain Score 04/04/22 0122 7     Pain Loc --      Pain Edu? --      Excl. in [place]? --     Updated Vital Signs: BP (!) 156/105 (BP Location: Left Arm)   Pulse 92   Temp 97.9 F (36.6 C) (Oral)   Resp 20   Ht '5\' 6"'$  (1.676 m)   Wt  110.2 kg   SpO2 98%   BMI 39.22 kg/m    General: Awake, no distress.  CV:  Good peripheral perfusion.  Resp:  Normal effort.  Abd:  No distention.  Other:  Left metatarsal of great toe warm and erythematous with limited range of motion secondary to pain.  2+ distal pulses.  Brisk, less than 5-second capillary refill.   ED Results / Procedures / Treatments  Labs (all labs ordered are listed, but only abnormal results are displayed) Labs Reviewed  URIC ACID - Abnormal; Notable for the following components:      Result Value   Uric Acid, Serum 9.2 (*)    All other components within normal limits     EKG  None   RADIOLOGY None   Official radiology report(s): No results found.   PROCEDURES:  Critical Care performed: No  Procedures   MEDICATIONS ORDERED IN ED: Medications  ketorolac (TORADOL) injection 30 mg (has no administration  in time range)     IMPRESSION / MDM / ASSESSMENT AND PLAN / ED COURSE  I reviewed the triage vital signs and the nursing notes.                             61 year old male presenting with gouty arthritis.  Will administer IM ketorolac here as patient is driving.  Discharged home with prescriptions for NSAIDs and analgesia.  Provide podiatric shoe and crutches.  Patient will follow-up with his PCP.  Strict return precautions given.  Patient verbalizes understanding and agrees with plan of care.  Patient's presentation is most consistent with acute, uncomplicated illness.   FINAL CLINICAL IMPRESSION(S) / ED DIAGNOSES   Final diagnoses:  Acute gout involving toe of left foot, unspecified cause     Rx / DC Orders   ED Discharge Orders     None        Note:  This document was prepared using Dragon voice recognition software and may include unintentional dictation errors.   Paulette Blanch, MD 04/04/22 [phone]

## 2022-04-24 ENCOUNTER — Encounter: Payer: Self-pay | Admitting: Urology
